# Patient Record
Sex: Male | Born: 1995 | Race: White | Hispanic: No | Marital: Single | Smoking: Former smoker
Health system: Southern US, Community
[De-identification: ages and names within clinical notes are randomized; demographics above are authoritative.]

## PROBLEM LIST (undated history)

## (undated) DIAGNOSIS — R55 Syncope and collapse: Secondary | ICD-10-CM

## (undated) DIAGNOSIS — Z9889 Other specified postprocedural states: Secondary | ICD-10-CM

## (undated) DIAGNOSIS — R569 Unspecified convulsions: Secondary | ICD-10-CM

## (undated) DIAGNOSIS — R112 Nausea with vomiting, unspecified: Secondary | ICD-10-CM

## (undated) HISTORY — PX: HAND SURGERY: SHX662

## (undated) HISTORY — DX: Unspecified convulsions: R56.9

## (undated) HISTORY — DX: Syncope and collapse: R55

---
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
## 2014-01-06 ENCOUNTER — Ambulatory Visit (INDEPENDENT_AMBULATORY_CARE_PROVIDER_SITE_OTHER): Payer: BC Managed Care – PPO | Admitting: Neurology

## 2014-01-06 ENCOUNTER — Encounter: Payer: Self-pay | Admitting: Neurology

## 2014-01-06 VITALS — BP 100/70 | HR 87 | Ht 69.0 in | Wt 152.4 lb

## 2014-01-06 DIAGNOSIS — G40A09 Absence epileptic syndrome, not intractable, without status epilepticus: Secondary | ICD-10-CM

## 2014-01-06 DIAGNOSIS — G40309 Generalized idiopathic epilepsy and epileptic syndromes, not intractable, without status epilepticus: Secondary | ICD-10-CM

## 2014-01-06 MED ORDER — LAMOTRIGINE ER 300 MG PO TB24: ORAL_TABLET | ORAL | Status: DC

## 2014-01-06 NOTE — Patient Instructions (Signed)
1. Continue Lamictal XR 300mg  daily 2. Call our office for any problems 3. Follow-up in 6 months  Seizure Precautions: 1. If medication has been prescribed for you to prevent seizures, take it exactly as directed.  Do not stop taking the medicine without talking to your doctor first, even if you have not had a seizure in a long time.   2. Avoid activities in which a seizure would cause danger to yourself or to others.  Don't operate dangerous machinery, swim alone, or climb in high or dangerous places, such as on ladders, roofs, or girders.  Do not drive unless your doctor says you may.  3. If you have any warning that you may have a seizure, lay down in a safe place where you can't hurt yourself.    4.  No driving for 6 months from last seizure, as [place] [place].   Please refer to the following link on the Epilepsy Foundation of America's website for more information: http://www.epilepsyfoundation.org/answerplace/Social/driving/drivingu.cfm   5.  Maintain good sleep hygiene.  6.  Contact your doctor if you have any problems that may be related to the medicine you are taking.  7.  Call 911 and bring the patient back to the ED if:        A.  The seizure lasts longer than 5 minutes.       B.  The patient doesn't awaken shortly after the seizure  C.  The patient has new problems such as difficulty seeing, speaking or moving  D.  The patient was injured during the seizure  E.  The patient has a temperature over 102 F (39C)  F.  The patient vomited and now is having trouble breathing

## 2014-01-06 NOTE — Progress Notes (Signed)
NEUROLOGY CONSULTATION NOTE  Jesse Henry MRN: [id number] DOB: [birth year]  Referring provider: Sharlot GowdaJohn Lalonde, MD Primary care provider: Sharlot GowdaJohn Lalonde, MD  Reason for consult:  Establish adult epilepsy care  Dear Dr Susann GivensLalonde:  Thank you for your kind referral of Jesse Henry for consultation of the above symptoms. Although his history is well known to you, please allow me to reiterate it for the purpose of our medical record. The patient was accompanied to the clinic by his father and girlfriend who also provide collateral information. Records and images were personally reviewed where available.  HISTORY OF PRESENT ILLNESS: This is a pleasant 18 year old right-handed man with a history of juvenile absence epilepsy presenting to establish adult epilepsy care. He started having staring spells at around age 89. His first generalized tonic-clonic seizure was at age 18. He denies any myoclonic jerks. His longest seizure-free interval is around 1 year. He had a recent generalized tonic-clonic seizure last 12/27/13 provoked by sleep deprivation with his newborn child. Prior to this, his last seizure was in November 2014. He usually has a warning prior to the seizure, he would feel groggy, like his head is floating on a cloud, for several hours before the GTC. His father notes that his whole face sinks in, like has not slept in a while, he would be confused, asking the same question repeatedly, then would stare before going into a convulsion. If he is on the phone or Xbox, symptoms would get worse. He has been taking Lamotrigine 300mg /day with no side effects. He had been switched from immediate release to extended-release, with improvement in insomnia.   He denies any headaches, dizziness, diplopia, dysarthria, dysphagia, neck/back pain, focal numbness/tingling/weakness, bowel/bladder dysfunction. He denies any olfactory/gustatory hallucinations, deja vu, rising epigastric sensation, focal  numbness/tingling/weakness, myoclonic jerks.  Prior AEDs: ethosuximide; dilantin; keppra  Epilepsy Risk Factors: His maternal aunt's daughter has seizures. Otherwise he had a normal birth and early development. No history of febrile convulsions, CNS infections, significant traumatic brain injury, neurosurgical procedures. He had a concussion with brief loss of consciousness after hitting his head on a soccer post.  Diagnostic Data: [facility] showed infrequent generalized polyspike and wave discharges while awake and runs a 2 to 3 Hz discharges lasting up to 2.5 seconds. There is a solitary 6-second episode of 3.5 Hz spiking wave discharges over the left hemisphere best seen in the left temporal chain without clinical accompaniments.  During sleep his generalized spike wave activity increased in frequency whether it is less than one polyspike in wave discharge every 10 seconds to more than one per minute. He again had clusters at 2 to 3 Hz lasting 2.5 seconds and an occasional multiple single discharges within a 10-second period. This was an ambulatory EEG.  I personally reviewed head CT without contrast done 03/2011 which was normal.  Last Lamictal level 04/16/13: 7.2  PAST MEDICAL HISTORY: Past Medical History  Diagnosis Date  . Seizures   . Syncope and collapse     PAST SURGICAL HISTORY: History reviewed. No pertinent past surgical history.  MEDICATIONS: Current Outpatient Prescriptions on File Prior to Visit  Medication Sig Dispense Refill  . ibuprofen (ADVIL,MOTRIN) 200 MG tablet Take 600 mg by mouth daily as needed for pain.    Lamictal XR 300mg  daily No current facility-administered medications on file prior to visit.    ALLERGIES: Allergies  Allergen Reactions  . Dilantin [Phenytoin Sodium Extended]   . Other Swelling  Blue cheese     FAMILY HISTORY: Family History  Problem Relation Age of Onset  . Cancer Maternal Aunt   . Cancer  Maternal Grandmother   . Depression Maternal Grandmother   . Early death Maternal Grandmother   . Heart attack Maternal Grandmother     Died at the age of 19  . Hypertension Paternal Grandmother   . Early death Paternal Grandmother   . Depression Paternal Grandfather     SOCIAL HISTORY: History   Social History  . Marital Status: Single    Spouse Name: N/A    Number of Children: N/A  . Years of Education: N/A   Occupational History  . Not on file.   Social History Main Topics  . Smoking status: Current Some Day Smoker    Types: Cigars  . Smokeless tobacco: Never Used     Comment: Smokes 2 cigarettes daily.  . Alcohol Use: No  . Drug Use: Yes    Special: Marijuana     Comment: Smokes marijuana twice a week  . Sexual Activity:    Partners: Female    Pharmacist, hospitalBirth Control/ Protection: Condom     Comment: ninth grade; soccer; as of May 2012   Other Topics Concern  . Not on file   Social History Narrative   Patient lives with mom, girlfriend and baby in a 2 story home.   Not working at this time.  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  Education: high school.      REVIEW OF SYSTEMS: Constitutional: No fevers, chills, or sweats, no generalized fatigue, change in appetite Eyes: No visual changes, double vision, eye pain Ear, nose and throat: No hearing loss, ear pain, nasal congestion, sore throat Cardiovascular: No chest pain, palpitations Respiratory:  No shortness of breath at rest or with exertion, wheezes GastrointestinaI: No nausea, vomiting, diarrhea, abdominal pain, fecal incontinence Genitourinary:  No dysuria, urinary retention or frequency Musculoskeletal:  No neck pain, back pain Integumentary: No rash, pruritus, skin lesions Neurological: as above Psychiatric: No depression, insomnia, anxiety Endocrine: No palpitations, fatigue, diaphoresis, mood swings, change in appetite, change in weight, increased thirst Hematologic/Lymphatic:  No anemia, purpura,  petechiae. Allergic/Immunologic: no itchy/runny eyes, nasal congestion, recent allergic reactions, rashes  PHYSICAL EXAM: Filed Vitals:   01/06/14 0956  BP: 100/70  Pulse: 87   General: No acute distress Head:  Normocephalic/atraumatic Eyes: Fundoscopic exam shows bilateral sharp discs, no vessel changes, exudates, or hemorrhages Neck: supple, no paraspinal tenderness, full range of motion Back: No paraspinal tenderness Heart: regular rate and rhythm Lungs: Clear to auscultation bilaterally. Vascular: No carotid bruits. Skin/Extremities: No rash, no edema Neurological Exam: Mental status: alert and oriented to person, place, and time, no dysarthria or aphasia, Fund of knowledge is appropriate.  Recent and remote memory are intact.  Attention and concentration are normal.    Able to name objects and repeat phrases. Cranial nerves: CN I: not tested CN II: pupils equal, round and reactive to light, visual fields intact, fundi unremarkable. CN III, IV, VI:  full range of motion, no nystagmus, no ptosis CN V: facial sensation intact CN VII: upper and lower face symmetric CN VIII: hearing intact to finger rub CN IX, X: gag intact, uvula midline CN XI: sternocleidomastoid and trapezius muscles intact CN XII: tongue midline Bulk & Tone: normal, no fasciculations. Motor: 5/5 throughout with no pronator drift. Sensation: intact to light touch, cold, pin, vibration and joint position sense.  No extinction to double simultaneous stimulation.  Romberg test negative Deep Tendon Reflexes: +2 throughout, no ankle clonus Plantar responses: downgoing bilaterally Cerebellar: no incoordination on finger to nose, heel to shin. No dysdiadochokinesia Gait: narrow-based and steady, able to tandem walk adequately. Tremor: none  IMPRESSION: This is a 18 year old right-handed man with a history of juvenile absence epilepsy with absence seizures and generalized tonic-clonic seizures. Last GTC was  12/27/13 provoked by sleep deprivation. We discussed the option of increasing dose of Lamictal as he will likely be sleep deprived with his newborn child for the next few months. He would like to continue on current dose of Lamictal XR 300mg /day for now but knows to call our office for any problems. We discussed avoidance of seizure triggers, including missing medications, sleep deprivation, and alcohol.  [place] driving laws were discussed with the patient, and he knows to stop driving after a seizure, until 6 months seizure-free. He will follow-up in 6 months or earlier if needed.   Thank you for allowing me to participate in the care of this patient. Please do not hesitate to call for any questions or concerns.   Patrcia Dolly, M.D.  CC: Dr. Susann Givens, Elveria Rising

## 2014-01-11 ENCOUNTER — Encounter: Payer: Self-pay | Admitting: Neurology

## 2014-01-21 DIAGNOSIS — G40309 Generalized idiopathic epilepsy and epileptic syndromes, not intractable, without status epilepticus: Secondary | ICD-10-CM

## 2014-03-07 ENCOUNTER — Telehealth: Payer: Self-pay | Admitting: Family

## 2014-03-07 NOTE — Telephone Encounter (Signed)
Jesse Henry left a message on general VM saying that he wanted to talk to Dr Sharene SkeansHickling. He can be reached at [phone]. TG

## 2014-03-09 NOTE — Telephone Encounter (Signed)
Jesse Henry left a message saying that he needed a prior authorization on his prescription. Then the message abruptly cut off. I called Jesse Henry and he said that he now has Medicaid and needs a PA in order to get his medicine filled. I told Jesse Henry that the pharmacy needed to send information to us or Dr Aquino's office in order for the PA to be processed. He will contact the pharmacy. TG

## 2014-03-10 NOTE — Telephone Encounter (Signed)
Jesse Henry called back to ask about the PA. I called the pharmacy and learned that he has Medicaid. I did the PA and called the pharmacy and Jesse Henry to let them know. TG

## 2014-04-27 ENCOUNTER — Encounter: Payer: Self-pay | Admitting: Medical

## 2014-04-27 ENCOUNTER — Ambulatory Visit (INDEPENDENT_AMBULATORY_CARE_PROVIDER_SITE_OTHER): Payer: Medicaid Other | Admitting: Medical

## 2014-04-27 VITALS — BP 116/76 | HR 63 | Temp 97.8°F | Wt 143.6 lb

## 2014-04-27 DIAGNOSIS — M7521 Bicipital tendinitis, right shoulder: Secondary | ICD-10-CM

## 2014-04-27 DIAGNOSIS — M62838 Other muscle spasm: Secondary | ICD-10-CM

## 2014-04-27 DIAGNOSIS — S39012A Strain of muscle, fascia and tendon of lower back, initial encounter: Secondary | ICD-10-CM | POA: Diagnosis not present

## 2014-04-27 DIAGNOSIS — S161XXA Strain of muscle, fascia and tendon at neck level, initial encounter: Secondary | ICD-10-CM

## 2014-04-27 MED ORDER — METHOCARBAMOL 500 MG PO TABS: 500.0000 mg | ORAL_TABLET | Freq: Four times a day (QID) | ORAL | Status: DC

## 2014-04-27 NOTE — Progress Notes (Signed)
Subjective: Here for pain.   He is a new father, has an infant.  He is primarily caregiver currently as mother works full time but they live together.  Usually walks around holding and rocking baby as baby enjoys moving better than being still.   The other day while bending down to get baby out of swing, felt sudden pain in back of neck radiating down to low back on the right.  Has had pain since.   He also has pains in front of right shoulder.  He is right handed.  No other arm or leg pain, no numbness, tingling, weakness.  No swelling.  No fever, no vision changes, no hearing loss.  No other recent trauma, fall, injury or strenuous activity.  Using OTC ibuprofen.  No other aggravating or relieving factors. No other complaint.  is on seizure medication, complaint, sees DR. Aquino.   Past Medical History  Diagnosis Date  . Seizures   . Syncope and collapse    ROS as in subjective   Objective: BP 116/76 mmHg  Pulse 63  Temp(Src) 97.8 F (36.6 C) (Oral)  Wt 143 lb 9.6 oz (65.137 kg)  Gen: wd, wn, nad Neck: tender posterolateral, bilat, and midline, mild pain with ROM which is slightly reduced due to pain, no thyromegaly, no mass, no lymphadenopathy Lungs clear Back: tender right paraspinal down entire back, some lower midline back tenderness, mild pain with ROM which is full otherwise, no scoliosis Legs nontender, normal ROM Arms - right biceps tendon origin tender, and mild pain with resisted biceps flexion otherwise arm exam unremarkable extremities neurovascularly intact   Assessment: Encounter Diagnoses  Name Primary?  . Biceps tendinitis, right Yes  . Neck strain, initial encounter   . Back strain, initial encounter   . Spasm of muscle     Plan: Biceps tendinitis - advised ice to right shoulder 1-2 times daily for 20 min, Aleve OTC, relative rest Neck and back strain and spasm - advised heat such as hot shower, massage, aleve, use pillows to support arms when holding baby,  and robaxin prn,but use caution, particularly avoid using if only him along at home with baby.   F/u 2wk.

## 2014-05-12 ENCOUNTER — Emergency Department (HOSPITAL_COMMUNITY)
Admission: EM | Admit: 2014-05-12 | Discharge: 2014-05-13 | Disposition: A | Discharge status: Home / Self Care | Payer: Medicaid Other | Attending: Emergency Medicine | Admitting: Emergency Medicine

## 2014-05-12 DIAGNOSIS — Z720 Tobacco use: Secondary | ICD-10-CM | POA: Diagnosis not present

## 2014-05-12 DIAGNOSIS — R0789 Other chest pain: Secondary | ICD-10-CM | POA: Insufficient documentation

## 2014-05-12 DIAGNOSIS — Z79899 Other long term (current) drug therapy: Secondary | ICD-10-CM | POA: Insufficient documentation

## 2014-05-12 DIAGNOSIS — G40909 Epilepsy, unspecified, not intractable, without status epilepticus: Secondary | ICD-10-CM | POA: Insufficient documentation

## 2014-05-12 DIAGNOSIS — R079 Chest pain, unspecified: Secondary | ICD-10-CM | POA: Diagnosis present

## 2014-05-12 DIAGNOSIS — R6883 Chills (without fever): Secondary | ICD-10-CM | POA: Diagnosis not present

## 2014-05-12 NOTE — ED Notes (Signed)
Bed: WA02 Expected date:  Expected time:  Means of arrival:  Comments: EMS 19 yo male chills, N/V and anxiety

## 2014-05-13 ENCOUNTER — Encounter (HOSPITAL_COMMUNITY): Payer: Self-pay | Admitting: Emergency Medicine

## 2014-05-13 ENCOUNTER — Emergency Department (HOSPITAL_COMMUNITY): Payer: Medicaid Other

## 2014-05-13 LAB — BASIC METABOLIC PANEL
Anion gap: 7 (ref 5–15)
BUN: 11 mg/dL (ref 6–20)
CHLORIDE: 108 mmol/L (ref 101–111)
CO2: 27 mmol/L (ref 22–32)
Calcium: 9.4 mg/dL (ref 8.9–10.3)
Creatinine, Ser: 1.23 mg/dL (ref 0.61–1.24)
GLUCOSE: 86 mg/dL (ref 70–99)
POTASSIUM: 3.4 mmol/L — AB (ref 3.5–5.1)
SODIUM: 142 mmol/L (ref 135–145)

## 2014-05-13 LAB — CBC
HCT: 44.5 % (ref 39.0–52.0)
Hemoglobin: 15.4 g/dL (ref 13.0–17.0)
MCH: 30.9 pg (ref 26.0–34.0)
MCHC: 34.6 g/dL (ref 30.0–36.0)
MCV: 89.4 fL (ref 78.0–100.0)
Platelets: 197 10*3/uL (ref 150–400)
RBC: 4.98 MIL/uL (ref 4.22–5.81)
RDW: 12.5 % (ref 11.5–15.5)
WBC: 5.4 10*3/uL (ref 4.0–10.5)

## 2014-05-13 LAB — I-STAT TROPONIN, ED: TROPONIN I, POC: 0 ng/mL (ref 0.00–0.08)

## 2014-05-13 MED ORDER — IBUPROFEN 200 MG PO TABS: 600.0000 mg | ORAL_TABLET | Freq: Three times a day (TID) | ORAL | Status: DC | PRN

## 2014-05-13 MED ORDER — KETOROLAC TROMETHAMINE 30 MG/ML IJ SOLN
30.0000 mg | Freq: Once | INTRAMUSCULAR | Status: AC
  Administered 2014-05-13: 30 mg via INTRAVENOUS
  Filled 2014-05-13: qty 1

## 2014-05-13 NOTE — ED Notes (Signed)
Per EMS: Pt c/o chills all day today, had emesis x 2 this morning, no nausea at this time. Pt also began having L side chest pain tender to palpation that started after fire department's arrival. Pt also states he hasn't been eating much the past couple of days d/t "money issues."

## 2014-05-13 NOTE — Discharge Instructions (Signed)
Chest Wall Pain Chest wall pain is pain felt in or around the chest bones and muscles. It may take up to 6 weeks to get better. It may take longer if you are active. Chest wall pain can happen on its own. Other times, things like germs, injury, coughing, or exercise can cause the pain. HOME CARE   Avoid activities that make you tired or cause pain. Try not to use your chest, belly (abdominal), or side muscles. Do not use heavy weights.  Put ice on the sore area.  Put ice in a plastic bag.  Place a towel between your skin and the bag.  Leave the ice on for 15-20 minutes for the first 2 days.  Only take medicine as told by your doctor. GET HELP RIGHT AWAY IF:   You have more pain or are very uncomfortable.  You have a fever.  Your chest pain gets worse.  You have new problems.  You feel sick to your stomach (nauseous) or throw up (vomit).  You start to sweat or feel lightheaded.  You have a cough with mucus (phlegm).  You cough up blood. MAKE SURE YOU:   Understand these instructions.  Will watch your condition.  Will get help right away if you are not doing well or get worse. Document Released: 06/12/2007 Document Revised: 03/18/2011 Document Reviewed: 08/20/2010 [facility] Patient Information 2015 [employer] [place]. This information is not intended to replace advice given to you by your health care provider. Make sure you discuss any questions you have with your health care provider. Your EKG chest xray and lab values are all normal

## 2014-05-13 NOTE — ED Provider Notes (Signed)
CSN: [id number]     Arrival date & time 05/12/14  2358 History   First MD Initiated Contact with Patient 05/13/14 0001     Chief Complaint  Patient presents with  . Chills  . Chest Pain     (Consider location/radiation/quality/duration/timing/severity/associated sxs/prior Treatment) HPI Comments: Patient states when was getting ready for bed he suddenly developed chills and stabbing chest pain that comes and goes lasting 15 seconds.  He reports that he had several episodes of vomiting throughout the days but was able to eat dinner about 4 PM and have a snack prior to bed. He states that he has been recently treated for muscle spasm in his neck and back. Denies CAD risk factors, recent travel.  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 male presenting with chest pain. The history is provided by the patient.  Chest Pain Pain location:  L chest Pain quality: aching   Pain radiates to:  Does not radiate Pain radiates to the back: no   Pain severity:  Mild Onset quality:  Sudden Duration:  1 hour Timing:  Constant Progression:  Unchanged Chronicity:  New Context: at rest   Context: not breathing, not lifting and no movement   Relieved by:  None tried Ineffective treatments:  None tried Associated symptoms: no abdominal pain, no AICD problem, no altered mental status, no anorexia, no anxiety, no back pain, no claudication, no cough, no diaphoresis, no dizziness, no dysphagia, no fatigue, no fever, no headache, no heartburn, no lower extremity edema, no nausea, no near-syncope, no numbness, no orthopnea, no palpitations, no PND, no shortness of breath, no syncope, not vomiting and no weakness   Risk factors: no aortic disease, no coronary artery disease, no diabetes mellitus, no Ehlers-Danlos syndrome, no high cholesterol, no hypertension, no immobilization, no prior DVT/PE and  no smoking     Past Medical History  Diagnosis Date  . Seizures   . Syncope and collapse    History reviewed. No pertinent past surgical history. Family History  Problem Relation Age of Onset  . Cancer Maternal Aunt   . Cancer Maternal Grandmother   . Depression Maternal Grandmother   . Early death Maternal Grandmother   . Heart attack Maternal Grandmother     Died at the age of 20  . Hypertension Paternal Grandmother   . Early death Paternal Grandmother   . Depression Paternal Grandfather    History  Substance Use Topics  . Smoking status: Current Some Day Smoker    Types: Cigars  . Smokeless tobacco: Never Used     Comment: Smokes 2 cigarettes daily.  . Alcohol Use: No    Review of Systems  Constitutional: Negative for fever, diaphoresis and fatigue.  HENT: Negative for trouble swallowing.   Respiratory: Negative for cough and shortness of breath.   Cardiovascular: Positive for chest pain. Negative for palpitations, orthopnea, claudication, syncope, PND and near-syncope.  Gastrointestinal: Negative for heartburn, nausea, vomiting, abdominal pain and anorexia.  Musculoskeletal: Negative for back pain.  Neurological: Negative for dizziness, weakness, numbness and headaches.      Allergies  Dilantin and Other  Home Medications   Prior to Admission medications   Medication Sig Start Date End Date Taking? Authorizing Provider  LamoTRIgine 300 MG TB24 Take 1 tablet at same time each day. Patient taking differently: Take 1  tablet by mouth daily.  01/06/14  Yes Van ClinesKaren M Aquino, MD  ibuprofen (ADVIL,MOTRIN) 200 MG tablet Take 3 tablets (600 mg total) by mouth every 8 (eight) hours as needed. 05/13/14   Earley FavorGail Ashritha Desrosiers, NP  methocarbamol (ROBAXIN) 500 MG tablet Take 1 tablet (500 mg total) by mouth 4 (four) times daily. Patient not taking: Reported on 05/13/2014 04/27/14   Kermit Baloavid S Tysinger, PA-C   BP 121/66 mmHg  Pulse 75  Temp(Src) 98.2 F (36.8 C) (Oral)  Resp 18  SpO2  100% Physical Exam  Constitutional: He is oriented to person, place, and time. He appears well-developed and well-nourished.  HENT:  Head: Normocephalic.  Mouth/Throat: Oropharynx is clear and moist.  Eyes: Pupils are equal, round, and reactive to light.  Neck: Normal range of motion.  Cardiovascular: Normal rate and regular rhythm.   No murmur heard. Pulmonary/Chest: Effort normal and breath sounds normal. He exhibits tenderness.  Abdominal: Soft. He exhibits no distension. There is no tenderness.  Musculoskeletal: Normal range of motion.  Lymphadenopathy:    He has no cervical adenopathy.  Neurological: He is alert and oriented to person, place, and time.  Skin: Skin is warm and dry.  Nursing note and vitals reviewed.   ED Course  Procedures (including critical care time) Labs Review Labs Reviewed  BASIC METABOLIC PANEL - Abnormal; Notable for the following:    Potassium 3.4 (*)    All other components within normal limits  CBC  I-STAT TROPOININ, ED    Imaging Review Dg Chest Port 1 View  05/13/2014   CLINICAL DATA:  Nausea and vomiting for 24 hours. Chest pain and chills, onset a few hours ago.  EXAM: PORTABLE CHEST - 1 VIEW  COMPARISON:  03/08/2010  FINDINGS: A single AP portable view of the chest demonstrates no focal airspace consolidation or alveolar edema. The lungs are grossly clear. There is no large effusion or pneumothorax. Cardiac and mediastinal contours appear unremarkable.  IMPRESSION: No active disease.   Electronically Signed   By: Ellery Plunkaniel R Mitchell M.D.   On: 05/13/2014 01:07     EKG Interpretation   Date/Time:  Friday May 13 2014 00:07:58 EDT Ventricular Rate:  91 PR Interval:  156 QRS Duration: 99 QT Interval:  352 QTC Calculation: 433 R Axis:   104 Text Interpretation:  Sinus rhythm S1,S2,S3 pattern RSR' in V1 or V2,  probably normal variant Confirmed by OTTER  MD, OLGA ([id number]) on 05/13/2014  12:20:47 AM      MDM   Final diagnoses:  Chest wall  pain         Earley FavorGail Garion Wempe, NP 05/13/14 [id number]  Marisa Severinlga Otter, MD 05/13/14 [phone]

## 2014-05-24 ENCOUNTER — Encounter: Payer: Self-pay | Admitting: Family Medicine

## 2014-05-24 ENCOUNTER — Ambulatory Visit (INDEPENDENT_AMBULATORY_CARE_PROVIDER_SITE_OTHER): Payer: Medicaid Other | Admitting: Family Medicine

## 2014-05-24 VITALS — BP 116/70 | HR 70 | Wt 144.4 lb

## 2014-05-24 DIAGNOSIS — R002 Palpitations: Secondary | ICD-10-CM

## 2014-05-24 NOTE — Progress Notes (Signed)
   Subjective:    Patient ID: Jesse Henry, male    DOB: 01/28/1995, 19 y.o.   MRN: [id number]  HPI He is here for evaluation of a several year history of intermittent left-sided chest pain. He describes the pain as throbbing and lasting for several seconds and then reoccurring several times per day. He states that then this can go away for a long period of time and then reoccur. When he has the pain he has not checked his pulse. He has not had any shortness of breath or diaphoresis.He was seen several years ago for this in the emergency room. They made a comment about heart size irregularity. He was seen recently for a different type of chest pain.   Review of Systems     Objective:   Physical Exam Alert and in no distress. Cardiac exam shows regular rhythm without murmurs or gallops. Lungs are clear to auscultation. No chest wall tenderness is noted. Review of his EKG and chest x-ray shows no major changes.       Assessment & Plan:  Palpitations - Plan: Cardiac event monitor Think the best way to help resolve this is to see if we can catch this on an event monitor. I explained that I doubt that this is really heart related to think we need to pursue this further. Hopefully the event monitor will help us determine it.

## 2014-05-24 NOTE — Progress Notes (Signed)
[facility]aw[facility] Scale[facility] office on Thursday May 19th @ 8am for event monitor. Pt is aware but may have to change that appt to a different day.

## 2014-06-17 ENCOUNTER — Ambulatory Visit (INDEPENDENT_AMBULATORY_CARE_PROVIDER_SITE_OTHER): Payer: Medicaid Other | Admitting: Family Medicine

## 2014-06-17 DIAGNOSIS — S61219A Laceration without foreign body of unspecified finger without damage to nail, initial encounter: Secondary | ICD-10-CM | POA: Diagnosis not present

## 2014-06-17 NOTE — Progress Notes (Signed)
   Subjective:    Patient ID: Jesse Henry, male    DOB: Aug 07, 1995, 19 y.o.   MRN: [id number]  HPI He sustained a 1/2 cm superficial horizontal laceration to the left index finger laterally at the DIP joint. This occurred 2 days ago.   Review of Systems     Objective:   Physical Exam 1/2 cm superficial laceration with good approximation of the edges is noted.       Assessment & Plan:  Finger laceration, initial encounter Wound was cleaned and Duo derm was applied. He is up-to-date on his shots.

## 2014-07-07 ENCOUNTER — Ambulatory Visit (INDEPENDENT_AMBULATORY_CARE_PROVIDER_SITE_OTHER): Payer: Medicaid Other | Admitting: Neurology

## 2014-07-07 ENCOUNTER — Encounter: Payer: Self-pay | Admitting: Neurology

## 2014-07-07 VITALS — BP 98/70 | HR 77 | Resp 16 | Ht 69.0 in | Wt 141.0 lb

## 2014-07-07 DIAGNOSIS — G40309 Generalized idiopathic epilepsy and epileptic syndromes, not intractable, without status epilepticus: Secondary | ICD-10-CM | POA: Diagnosis not present

## 2014-07-07 DIAGNOSIS — G40A09 Absence epileptic syndrome, not intractable, without status epilepticus: Secondary | ICD-10-CM | POA: Diagnosis not present

## 2014-07-07 MED ORDER — LAMOTRIGINE ER 300 MG PO TB24: ORAL_TABLET | ORAL | Status: DC
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
## 2014-07-07 NOTE — Progress Notes (Signed)
NEUROLOGY FOLLOW UP OFFICE NOTE  Jesse Henry [id number]  HISTORY OF PRESENT ILLNESS: I had the pleasure of seeing Jesse Henry in follow-up in the neurology clinic on 07/07/2014.  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 He is again accompanied by his father who helps supplement the history today. Since his last visit, he has been doing well with no further seizures since 12/27/13. He had some sleep deprivation with his newborn a few months ago, but once he got used to his new role as a father, he has been getting 7 hours of sleep. He denies any headaches, dizziness, diplopia, dysarthria, dysphagia, neck/back pain, focal numbness/tingling/weakness, bowel/bladder dysfunction. He denies any olfactory/gustatory hallucinations, deja vu, rising epigastric sensation, focal numbness/tingling/weakness, myoclonic jerks.   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 He started having staring spells at around age 89. His first generalized tonic-clonic seizure was at age 89. He denies any myoclonic jerks. His longest seizure-free interval is around 1 year. He had a recent generalized tonic-clonic seizure last 12/27/13 provoked by sleep deprivation with his newborn child. Prior to this, his last seizure was in November 2014. He usually has a warning prior to the seizure, he would feel groggy, like his head is floating on a cloud, for several hours before the GTC. His father notes that his whole face sinks in, like has not slept in a while, he would be confused, asking the same question repeatedly, then would stare before going into a convulsion. If he is on the phone or Xbox, symptoms would get worse. He has been taking Lamotrigine 300mg /day with no side effects. He had been switched from immediate release to extended-release, with improvement in insomnia.   Prior AEDs: ethosuximide; dilantin;  keppra  Epilepsy Risk Factors: His maternal aunt's daughter has seizures. Otherwise he had a normal birth and early development. No history of febrile convulsions, CNS infections, significant traumatic brain injury, neurosurgical procedures. He had a concussion with brief loss of consciousness after hitting his head on a soccer post.  Diagnostic Data: [facility] showed infrequent generalized polyspike and wave discharges while awake and runs a 2 to 3 Hz discharges lasting up to 2.5 seconds. There is a solitary 6-second episode of 3.5 Hz spiking wave discharges over the left hemisphere best seen in the left temporal chain without clinical accompaniments.  During sleep his generalized spike wave activity increased in frequency whether it is less than one polyspike in wave discharge every 10 seconds to more than one per minute. He again had clusters at 2 to 3 Hz lasting 2.5 seconds and an occasional multiple single discharges within a 10-second period. This was an ambulatory EEG.  I personally reviewed head CT without contrast done 03/2011 which was normal.  Last Lamictal level 04/16/13: 7.2  PAST MEDICAL HISTORY: Past Medical History  Diagnosis Date  . Seizures   . Syncope and collapse     MEDICATIONS: Current Outpatient Prescriptions on File Prior to Visit  Medication Sig Dispense Refill  . LamoTRIgine 300 MG TB24 Take 1 tablet at same time each day. (Patient taking differently: Take 1 tablet by mouth daily. ) 30 tablet 11  . ibuprofen (ADVIL,MOTRIN) 200 MG tablet Take 3 tablets (600 mg total) by mouth every 8 (eight) hours as needed. (Patient not taking: Reported on 07/07/2014) 30 tablet 0   No current facility-administered medications  on file prior to visit.    ALLERGIES: Allergies  Allergen Reactions  . Dilantin [Phenytoin Sodium Extended] Other (See Comments)    Childhood allergy  . Other Swelling    Blue cheese     FAMILY HISTORY: Family  History  Problem Relation Age of Onset  . Cancer Maternal Aunt   . Cancer Maternal Grandmother   . Depression Maternal Grandmother   . Early death Maternal Grandmother   . Heart attack Maternal Grandmother     Died at the age of 66  . Hypertension Paternal Grandmother   . Early death Paternal Grandmother   . Depression Paternal Grandfather     SOCIAL HISTORY: History   Social History  . Marital Status: Single    Spouse Name: N/A  . Number of Children: N/A  . Years of Education: N/A   Occupational History  . Not on file.   Social History Main Topics  . Smoking status: Former Smoker    Types: Cigars  . Smokeless tobacco: Never Used     Comment: Smokes 2 cigarettes daily.  . Alcohol Use: No  . Drug Use: Yes    Special: Marijuana     Comment: Smokes marijuana twice a week  . Sexual Activity:    Partners: Female    Pharmacist, hospital Protection: Condom     Comment: ninth grade; soccer; as of May 2012   Other Topics Concern  . Not on file   Social History Narrative   Patient lives with mom, girlfriend and baby in a 2 story home.   Not working at this time.  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  Education: high school.      REVIEW OF SYSTEMS: Constitutional: No fevers, chills, or sweats, no generalized fatigue, change in appetite Eyes: No visual changes, double vision, eye pain Ear, nose and throat: No hearing loss, ear pain, nasal congestion, sore throat Cardiovascular: No chest pain, palpitations Respiratory:  No shortness of breath at rest or with exertion, wheezes GastrointestinaI: No nausea, vomiting, diarrhea, abdominal pain, fecal incontinence Genitourinary:  No dysuria, urinary retention or frequency Musculoskeletal:  No neck pain, back pain Integumentary: No rash, pruritus, skin lesions Neurological: as above Psychiatric: No depression, insomnia, anxiety Endocrine: No palpitations, fatigue, diaphoresis, mood swings, change in appetite, change in weight,  increased thirst Hematologic/Lymphatic:  No anemia, purpura, petechiae. Allergic/Immunologic: no itchy/runny eyes, nasal congestion, recent allergic reactions, rashes  PHYSICAL EXAM: Filed Vitals:   07/07/14 0949  BP: 98/70  Pulse: 77  Resp: 16   General: No acute distress Head:  Normocephalic/atraumatic Neck: supple, no paraspinal tenderness, full range of motion Heart:  Regular rate and rhythm Lungs:  Clear to auscultation bilaterally Back: No paraspinal tenderness Skin/Extremities: No rash, no edema Neurological Exam: alert and oriented to person, place, and time. No aphasia or dysarthria. Fund of knowledge is appropriate.  Recent and remote memory are intact. 3/3 delayed recall.  Attention and concentration are normal.    Able to name objects and repeat phrases. Cranial nerves: Pupils equal, round, reactive to light.  Fundoscopic exam unremarkable, no papilledema. Extraocular movements intact with no nystagmus. Visual fields full. Facial sensation intact. No facial asymmetry. Tongue, uvula, palate midline.  Motor: Bulk and tone normal, muscle strength 5/5 throughout with no pronator drift.  Sensation to light touch intact.  No extinction to double simultaneous stimulation.  Deep tendon reflexes 2+ throughout, toes downgoing.  Finger to nose testing intact.  Gait narrow-based and steady, able to tandem walk  adequately.  Romberg negative.  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 Last GTC was 12/27/13 provoked by sleep deprivation. He has been seizure-free since then on Lamictal XR 300mg  daily, no side effects. Baseline Lamictal level will be done. He would like to return to driving and will be sending [place] the [facility] forms to fill out. We again discussed avoidance of seizure triggers, including missing medications, sleep deprivation, and alcohol. He is aware of [place] driving laws to stop driving after  a seizure, until 6 months seizure-free. He will follow-up in 6 months or earlier if needed.   Thank you for allowing me to participate in his care.  Please do not hesitate to call for any questions or concerns.  The duration of this appointment visit was 15 minutes of face-to-face time with the patient.  Greater than 50% of this time was spent in counseling, explanation of diagnosis, planning of further management, and coordination of care.   Patrcia Dolly, M.D.   CC: Dr. Susann Givens

## 2014-07-19 LAB — LAMOTRIGINE LEVEL: Lamotrigine Lvl: 3.6 ug/mL — ABNORMAL LOW (ref 4.0–18.0)

## 2014-07-27 ENCOUNTER — Encounter: Payer: Self-pay | Admitting: Medical

## 2014-07-27 ENCOUNTER — Ambulatory Visit
Admission: RE | Admit: 2014-07-27 | Discharge: 2014-07-27 | Disposition: A | Discharge status: Home / Self Care | Payer: Medicaid Other | Source: Ambulatory Visit | Attending: Medical | Admitting: Medical

## 2014-07-27 ENCOUNTER — Ambulatory Visit (INDEPENDENT_AMBULATORY_CARE_PROVIDER_SITE_OTHER): Payer: Medicaid Other | Admitting: Medical

## 2014-07-27 VITALS — BP 98/70 | HR 84 | Temp 98.4°F | Resp 15 | Wt 142.0 lb

## 2014-07-27 DIAGNOSIS — S6991XA Unspecified injury of right wrist, hand and finger(s), initial encounter: Secondary | ICD-10-CM

## 2014-07-27 DIAGNOSIS — M79641 Pain in right hand: Secondary | ICD-10-CM | POA: Diagnosis not present

## 2014-07-27 MED ORDER — HYDROCODONE-ACETAMINOPHEN 5-325 MG PO TABS: 1.0000 | ORAL_TABLET | Freq: Four times a day (QID) | ORAL | Status: DC | PRN

## 2014-07-27 MED ORDER — IBUPROFEN 600 MG PO TABS: 600.0000 mg | ORAL_TABLET | Freq: Three times a day (TID) | ORAL | Status: DC | PRN

## 2014-07-27 NOTE — Patient Instructions (Signed)
Encounter Diagnosis  Name Primary?  . Hand injury, right, initial encounter Yes    Recommendations:  Go for xray  Ice 10-20 minutes several times a day if possible  Use a resting hand splint that isolated the knuckles so you are in rested flexion of the fingers  Use Ibuprofen  2-3 times daily the next few days  Use Hydrocodone if needed for worse pain  If no fracture ,this may take 1- 2 weeks to feel back to normal
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
## 2014-08-16 ENCOUNTER — Telehealth: Payer: Self-pay | Admitting: Family Medicine

## 2014-08-16 NOTE — Telephone Encounter (Signed)
Patient was notified of result. He states he is taking his medication as prescribed.

## 2014-08-16 NOTE — Telephone Encounter (Signed)
-----   Message from Van Clines, MD sent at 08/16/2014  3:20 PM EDT ----- Pls let him know the Lamictal level was lower than previous, as long as he is taking his medications regularly, no need to make any medication changes. Thanks

## 2014-09-29 ENCOUNTER — Encounter: Payer: Self-pay | Admitting: Family Medicine

## 2014-10-19 ENCOUNTER — Ambulatory Visit: Payer: Medicaid Other | Admitting: Medical

## 2014-10-24 ENCOUNTER — Encounter: Payer: Self-pay | Admitting: Medical

## 2014-10-24 ENCOUNTER — Ambulatory Visit (INDEPENDENT_AMBULATORY_CARE_PROVIDER_SITE_OTHER): Payer: Medicaid Other | Admitting: Medical

## 2014-10-24 VITALS — BP 116/64 | HR 68 | Temp 97.9°F | Wt 136.0 lb

## 2014-10-24 DIAGNOSIS — M545 Low back pain, unspecified: Secondary | ICD-10-CM

## 2014-10-24 NOTE — Progress Notes (Signed)
   Subjective: Here for c/o low back pain.   Been having back pain for months, approximately 10 months, about the same time his baby was born.    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   The pain is mostly low back bilat.   His girlfriend says he has a bad curvature of the spine   He reports pain daily, all day.   No pain radiating into the legs. No numbness, tingling, or weakness in the legs.   No prior eval with xray or no prior back specialist.  Using occasional ibuprofen for pain. No recent injury, trauma, fall, no joint swelling, no other orthopedic concern at this time.  No other aggravating or relieving factors. No other complaint.  Past Medical History  Diagnosis Date  . Seizures (HCC)   . Syncope and collapse    ROS as in subjective   Objective: BP 116/64 mmHg  Pulse 68  Temp(Src) 97.9 F (36.6 C)  Wt 136 lb (61.689 kg)  Gen: wd, wn, nad Skin: unremarkable Back: mild tenderness lumbar paraspinal region vs SI joint bilat, he has very good flexibility of the back, normal ROM, no pain with ROM, very slight rightward curvature of lower thoracic and lumbar region Legs non tender, normal ROM of hips, knees, ankles, and no deformity other than mildly flat arches Legs neurovascularly intact Ext:no edema    Assessment: Encounter Diagnosis  Name Primary?  . Bilateral low back pain without sciatica Yes    Plan: discussed concerns, minimal exam findings.  Advised daily stretching routine as demonstrated and 2-3 times weekly, doing core stretching and back stringing exercising as demonstrated.  Consider shoe inserts for low arches.  Advised daily NSAID for the next 1-3 weeks.   Can go for xray if desired given the several month hx/o pain, although exam is not all that contributory today.   F/u either pending xray or in 54mo.

## 2014-10-26 ENCOUNTER — Telehealth: Payer: Self-pay

## 2014-10-26 NOTE — Telephone Encounter (Signed)
Was he informed to go to the xray I just noticed his order today and seen he hadn't gotten the xray yet.

## 2014-10-26 NOTE — Telephone Encounter (Signed)
He may or may not get it right away, which we discussed other things to do first.  So this may just sit in the que.

## 2015-01-06 ENCOUNTER — Encounter: Payer: Self-pay | Admitting: Neurology

## 2015-01-06 ENCOUNTER — Ambulatory Visit (INDEPENDENT_AMBULATORY_CARE_PROVIDER_SITE_OTHER): Payer: Medicaid Other | Admitting: Neurology

## 2015-01-06 VITALS — BP 100/60 | HR 76 | Ht 69.0 in | Wt 130.0 lb

## 2015-01-06 DIAGNOSIS — G40A09 Absence epileptic syndrome, not intractable, without status epilepticus: Secondary | ICD-10-CM | POA: Diagnosis not present

## 2015-01-06 MED ORDER — LAMOTRIGINE ER 200 MG PO TB24: ORAL_TABLET | ORAL | Status: DC

## 2015-01-06 NOTE — Progress Notes (Signed)
NEUROLOGY FOLLOW UP OFFICE NOTE  ERIVERTO BYRNES [id number]  HISTORY OF PRESENT ILLNESS: I had the pleasure of seeing Jesse Henry in follow-up in the neurology clinic on 01/06/2015.  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 He is again accompanied by his girlfried who helps supplement the history today. Since his last visit, he denies any convulsions since 12/27/13. His Lamictal level on Lamotrigine ER /day done last 07/2014 was 3.6. Since he was not having any seizures, no medication changes were made. He reports that over the past 6 months, he would have a few days where he "just feels off," as if he would have a seizure but it would not progress. These usually occur in the setting of sleep deprivation. He reports his brain is "too slow," his girlfriend reports that he does not talk much and looks like he is trying to process what is being told to him. She notices this around once a month. The last time this occurred was 12/15/14. His girlfriend denies any staring/unresponsive episodes, he denies any headaches, dizziness, diplopia, dysarthria, dysphagia, neck/back pain, focal numbness/tingling/weakness, bowel/bladder dysfunction. He denies any olfactory/gustatory hallucinations, deja vu, rising epigastric sensation, focal numbness/tingling/weakness, myoclonic jerks. He has been having weight loss, and reports that even if he is starving, he just does not have the urge to eat. He weighed 141 lbs in June, today he weighs 130 lbs. He reports mood is okay, but he was wondering about depression at one point, and gets a little more irritable. He smokes pot frequently.   HPI: This is a pleasant 19 yo RH man with a history of juvenile absence epilepsy. He started having staring spells at around age 44. His first generalized tonic-clonic seizure was at age 17. He denies any myoclonic jerks. His longest seizure-free interval is around 1 year. He had a generalized tonic-clonic  seizure last 12/27/13 provoked by sleep deprivation with his newborn child. Prior to this, his last seizure was in November 2014. He usually has a warning prior to the seizure, he would feel groggy, like his head is floating on a cloud, for several hours before the GTC. His father notes that his whole face sinks in, like has not slept in a while, he would be confused, asking the same question repeatedly, then would stare before going into a convulsion. If he is on the phone or Xbox, symptoms would get worse. He had been taking Lamotrigine /day with no side effects. He had been switched from immediate release to extended-release, with improvement in insomnia.   Prior AEDs: ethosuximide; dilantin; keppra  Epilepsy Risk Factors: His maternal aunt's daughter has seizures. Otherwise he had a normal birth and early development. No history of febrile convulsions, CNS infections, significant traumatic brain injury, neurosurgical procedures. He had a concussion with brief loss of consciousness after hitting his head on a soccer post.  Diagnostic Data: Prolonged nocturnal EEG at [facility] showed infrequent generalized polyspike and wave discharges while awake and runs a 2 to 3 Hz discharges lasting up to 2.5 seconds. There is a solitary 6-second episode of 3.5 Hz spiking wave discharges over the left hemisphere best seen in the left temporal chain without clinical accompaniments.  During sleep his generalized spike wave activity increased in frequency whether it is less than one polyspike in wave discharge every 10 seconds to more than one per minute. He again had clusters at 2 to 3 Hz lasting 2.5 seconds and an occasional multiple single discharges  within a 10-second period. This was an ambulatory EEG.  I personally reviewed head CT without contrast done 03/2011 which was normal.  Last Lamictal level 04/16/13: 7.2, 07/18/14 3.6.  PAST MEDICAL HISTORY: Past Medical History  Diagnosis Date  . Seizures  (HCC)   . Syncope and collapse     MEDICATIONS: Current Outpatient Prescriptions on File Prior to Visit  Medication Sig Dispense Refill  . ibuprofen (ADVIL,MOTRIN) 600 MG tablet Take 1 tablet (600 mg total) by mouth every 8 (eight) hours as needed. 30 tablet 0  . LamoTRIgine 300 MG TB24 Take 1 tablet at same time each day. 30 tablet 11   No current facility-administered medications on file prior to visit.    ALLERGIES: Allergies  Allergen Reactions  . Dilantin [Phenytoin Sodium Extended] Other (See Comments)    Childhood allergy  . Other Swelling    Blue cheese     FAMILY HISTORY: Family History  Problem Relation Age of Onset  . Cancer Maternal Aunt   . Cancer Maternal Grandmother   . Depression Maternal Grandmother   . Early death Maternal Grandmother   . Heart attack Maternal Grandmother     Died at the age of 4  . Hypertension Paternal Grandmother   . Early death Paternal Grandmother   . Depression Paternal Grandfather     SOCIAL HISTORY: Social History   Social History  . Marital Status: Single    Spouse Name: N/A  . Number of Children: N/A  . Years of Education: N/A   Occupational History  . Not on file.   Social History Main Topics  . Smoking status: Current Every Day Smoker    Types: Cigarettes  . Smokeless tobacco: Never Used     Comment: Smokes 2 cigarettes daily.  . Alcohol Use: No  . Drug Use: Yes    Special: Marijuana     Comment: Smokes marijuana twice a week  . Sexual Activity:    Partners: Female    Pharmacist, hospital Protection: Condom     Comment: ninth grade; soccer; as of May 2012   Other Topics Concern  . Not on file   Social History Narrative   Patient lives with mom, girlfriend and baby in a 2 story home.   Not working at this time.  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  Education: high school.      REVIEW OF SYSTEMS: Constitutional: No fevers, chills, or sweats, no generalized fatigue, change in appetite Eyes: No  visual changes, double vision, eye pain Ear, nose and throat: No hearing loss, ear pain, nasal congestion, sore throat Cardiovascular: No chest pain, palpitations Respiratory:  No shortness of breath at rest or with exertion, wheezes GastrointestinaI: No nausea, vomiting, diarrhea, abdominal pain, fecal incontinence Genitourinary:  No dysuria, urinary retention or frequency Musculoskeletal:  No neck pain, +back pain Integumentary: No rash, pruritus, skin lesions Neurological: as above Psychiatric: No depression, insomnia, anxiety Endocrine: No palpitations, fatigue, diaphoresis, mood swings, change in appetite,+ change in weight, no increased thirst Hematologic/Lymphatic:  No anemia, purpura, petechiae. Allergic/Immunologic: no itchy/runny eyes, nasal congestion, recent allergic reactions, rashes  PHYSICAL EXAM: Filed Vitals:   01/06/15 1008  BP: 100/60  Pulse: 76   General: No acute distress Head:  Normocephalic/atraumatic Neck: supple, no paraspinal tenderness, full range of motion Heart:  Regular rate and rhythm Lungs:  Clear to auscultation bilaterally Back: No paraspinal tenderness Skin/Extremities: No rash, no edema Neurological Exam: alert and oriented to person, place, and time. No aphasia  or dysarthria. Fund of knowledge is appropriate.  Recent and remote memory are intact. Attention and concentration are normal.    Able to name objects and repeat phrases. Cranial nerves: Pupils equal, round, reactive to light.  Fundoscopic exam unremarkable, no papilledema. Extraocular movements intact with no nystagmus. Visual fields full. Facial sensation intact. No facial asymmetry. Tongue, uvula, palate midline.  Motor: Bulk and tone normal, muscle strength 5/5 throughout with no pronator drift.  Sensation to light touch intact.  No extinction to double simultaneous stimulation.  Deep tendon reflexes 2+ throughout, toes downgoing.  Finger to nose testing intact.  Gait narrow-based and steady,  able to tandem walk adequately.  Romberg negative.  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 Last GTC was 12/27/13 provoked by sleep deprivation. No convulsions on Lamictal XR 300mg  daily, last Lamictal level was 3.6 (ref 4-12). We discussed that with the symptoms where he feels he is about to have a seizure but does not progress and low Lamictal level, would increase dose to 400mg /day. We again discussed avoidance of seizure triggers, including missing medications, sleep deprivation, and alcohol. We also discussed weight loss, this may relate to marijuana use, he was advised to stop, versus possible depression. He will discuss weight loss with his PCP. He is aware of [place] driving laws to stop driving after a seizure, until 6 months seizure-free. He will follow-up in 6 months or earlier if needed.   Thank you for allowing me to participate in his care.  Please do not hesitate to call for any questions or concerns.  The duration of this appointment visit was 25 minutes of face-to-face time with the patient.  Greater than 50% of this time was spent in counseling, explanation of diagnosis, planning of further management, and coordination of care.   Patrcia DollyKaren Aquino, M.D.   CC: Dr. Susann GivensLalonde

## 2015-01-06 NOTE — Patient Instructions (Addendum)
1. After you finish this prescription, increase Lamictal XR 400mg  daily (take 200mg  2 tablets daily) 2. Discuss weight loss with Dr. Susann GivensLalonde (including possible depression) 3. Follow-up in 6 months, call for any changes  Seizure Precautions: 1. If medication has been prescribed for you to prevent seizures, take it exactly as directed.  Do not stop taking the medicine without talking to your doctor first, even if you have not had a seizure in a long time.   2. Avoid activities in which a seizure would cause danger to yourself or to others.  Don't operate dangerous machinery, swim alone, or climb in high or dangerous places, such as on ladders, roofs, or girders.  Do not drive unless your doctor says you may.  3. If you have any warning that you may have a seizure, lay down in a safe place where you can't hurt yourself.    4.  No driving for 6 months from last seizure, [place].   Please refer to the following link on the Epilepsy Foundation of America's website for more information: http://www.epilepsyfoundation.org/answerplace/Social/driving/drivingu.cfm   5.  Maintain good sleep hygiene. Avoid alcohol.  6.  Contact your doctor if you have any problems that may be related to the medicine you are taking.  7.  Call 911 and bring the patient back to the ED if:        A.  The seizure lasts longer than 5 minutes.       B.  The patient doesn't awaken shortly after the seizure  C.  The patient has new problems such as difficulty seeing, speaking or moving  D.  The patient was injured during the seizure  E.  The patient has a temperature over 102 F (39C)  F.  The patient vomited and now is having trouble breathing

## 2015-02-01 ENCOUNTER — Telehealth: Payer: Self-pay | Admitting: Neurology

## 2015-02-01 NOTE — Telephone Encounter (Signed)
Returned call to patient. He wanted to verify that new Rx was sent to his pharmacy when he was here for the Lamictal change. He states when he went to the pharmacy they gave him his old Rx. I told him that the new Rx for Lamcital XR 200 mg to take 2 tablets a day was sent to his pharmacy on 01/06/15. I'm showing on my end that the Rx was received by the pharmacy. He is going to go back to the pharmacy to make sure they fill new Rx.

## 2015-02-01 NOTE — Telephone Encounter (Signed)
Pt called and had a question about his medication Lamotrigine dosage/Dawn CB# [phone]

## 2015-02-27 ENCOUNTER — Encounter: Payer: Self-pay | Admitting: Neurology

## 2015-03-15 ENCOUNTER — Encounter: Payer: Self-pay | Admitting: Family Medicine

## 2015-03-15 ENCOUNTER — Ambulatory Visit (INDEPENDENT_AMBULATORY_CARE_PROVIDER_SITE_OTHER): Payer: Medicaid Other | Admitting: Family Medicine

## 2015-03-15 VITALS — BP 104/70 | HR 103 | Wt 128.0 lb

## 2015-03-15 DIAGNOSIS — M79641 Pain in right hand: Secondary | ICD-10-CM

## 2015-03-15 NOTE — Progress Notes (Signed)
   Subjective:    Patient ID: Jesse Henry, male    DOB: 12/24/1995, 19 y.o.   MRN: [id number]  HPI Patient states that he had his right hand caught in a car door approximately one month ago.he has been using a splint however it continues to give him trouble especially on the distal fourth metacarpal.   Review of Systems     Objective:   Physical Exam Swelling and tenderness noted over the dorsal aspect of the hand near the fourth MCP joint. Clenched fist does show a slight step-off over the fourth MCP joint.anal palpation over that area.       Assessment & Plan:  Right hand pain - Plan: DG Hand Complete Right I suspect a fracture and possible referral to orthopedics.

## 2015-03-16 ENCOUNTER — Ambulatory Visit
Admission: RE | Admit: 2015-03-16 | Discharge: 2015-03-16 | Disposition: A | Discharge status: Home / Self Care | Payer: Medicaid Other | Source: Ambulatory Visit | Attending: Family Medicine | Admitting: Family Medicine

## 2015-03-16 DIAGNOSIS — M79641 Pain in right hand: Secondary | ICD-10-CM

## 2015-03-17 ENCOUNTER — Telehealth: Payer: Self-pay | Admitting: Family Medicine

## 2015-03-17 NOTE — Telephone Encounter (Signed)
Pt called for Xray results

## 2015-03-21 ENCOUNTER — Telehealth: Payer: Self-pay | Admitting: *Deleted

## 2015-03-21 NOTE — Telephone Encounter (Signed)
[facility] with Appointment time for Jesse Henry with Dr. Earl GalaWinegold: 03-30-15 @ 4:00. Patient has been notified

## 2015-06-08 ENCOUNTER — Encounter: Payer: Self-pay | Admitting: Family Medicine

## 2015-07-12 ENCOUNTER — Ambulatory Visit: Payer: Medicaid Other | Admitting: Neurology

## 2015-09-22 ENCOUNTER — Telehealth: Payer: Self-pay | Admitting: Neurology

## 2015-09-22 ENCOUNTER — Other Ambulatory Visit: Payer: Self-pay

## 2015-09-22 MED ORDER — LAMOTRIGINE 200 MG PO TABS: 200.0000 mg | ORAL_TABLET | Freq: Two times a day (BID) | ORAL | 0 refills | Status: DC

## 2015-09-22 NOTE — Telephone Encounter (Signed)
Advised pt that RX was sent and it should be cheaper.

## 2015-09-22 NOTE — Telephone Encounter (Signed)
Jesse Henry 07/24/1995. His # is [id number] He was calling in regarding Lamotrigine 200 mg. He is unable to get medication filled due to income and insurance. He was wanting to know if he could get some samples or enough until he is able to see about another Insurance on Monday 9/18. Thank you

## 2015-09-22 NOTE — Telephone Encounter (Signed)
Lamictal does not have any more samples because it is generic. Would do Lamotrigine immediate-release 200mg  1 tab BID until he gets insurance back. The immediate-release should be cheaper, but let us know if still cost-prohibitive. thanks

## 2015-09-22 NOTE — Telephone Encounter (Signed)
Is there another medication we can offer that we have samples of? Thank you.

## 2015-10-18 ENCOUNTER — Other Ambulatory Visit: Payer: Self-pay | Admitting: Neurology

## 2015-10-19 ENCOUNTER — Other Ambulatory Visit: Payer: Self-pay

## 2015-10-19 NOTE — Telephone Encounter (Signed)
RX sent to pharmacy  

## 2015-11-06 ENCOUNTER — Ambulatory Visit: Payer: Medicaid Other | Admitting: Neurology

## 2015-11-17 ENCOUNTER — Other Ambulatory Visit: Payer: Self-pay | Admitting: Neurology

## 2015-11-17 NOTE — Telephone Encounter (Signed)
Ok, pls send 6 refills, thanks

## 2015-11-17 NOTE — Telephone Encounter (Signed)
RX sent to pharmacy  

## 2015-11-17 NOTE — Telephone Encounter (Signed)
Contacted patient to see if he had his insurance fixed for me to send in a refill on the Lamictal XR . He states he does not and wants to know if he can have a refill on Lamictal 200mg  tablet. Is this okay?

## 2016-03-19 ENCOUNTER — Encounter: Payer: Self-pay | Admitting: Neurology

## 2016-03-19 ENCOUNTER — Other Ambulatory Visit: Payer: Self-pay

## 2016-03-19 ENCOUNTER — Ambulatory Visit (INDEPENDENT_AMBULATORY_CARE_PROVIDER_SITE_OTHER): Payer: Self-pay | Admitting: Neurology

## 2016-03-19 VITALS — BP 106/70 | HR 89 | Temp 97.9°F | Resp 16 | Ht 69.0 in | Wt 132.2 lb

## 2016-03-19 DIAGNOSIS — G40A09 Absence epileptic syndrome, not intractable, without status epilepticus: Secondary | ICD-10-CM

## 2016-03-19 MED ORDER — LAMOTRIGINE 200 MG PO TABS: 200.0000 mg | ORAL_TABLET | Freq: Two times a day (BID) | ORAL | 6 refills | Status: DC

## 2016-03-19 MED ORDER — ESCITALOPRAM OXALATE 10 MG PO TABS: 10.0000 mg | ORAL_TABLET | Freq: Every day | ORAL | 6 refills | Status: DC

## 2016-03-19 NOTE — Patient Instructions (Signed)
1. Bloodwork for Lamictal level 2. Continue Lamotrigine 200mg  twice a day 3. Start Lexapro 10mg  daily 4. Follow-up in 3 months, call for any changes  Seizure Precautions: 1. If medication has been prescribed for you to prevent seizures, take it exactly as directed.  Do not stop taking the medicine without talking to your doctor first, even if you have not had a seizure in a long time.   2. Avoid activities in which a seizure would cause danger to yourself or to others.  Don't operate dangerous machinery, swim alone, or climb in high or dangerous places, such as on ladders, roofs, or girders.  Do not drive unless your doctor says you may.  3. If you have any warning that you may have a seizure, lay down in a safe place where you can't hurt yourself.    4.  No driving for 6 months from last seizure, [place].   Please refer to the following link on the Epilepsy Foundation of America's website for more information: http://www.epilepsyfoundation.org/answerplace/Social/driving/drivingu.cfm   5.  Maintain good sleep hygiene. Avoid alcohol.  6.  Notify your neurology if you are planning pregnancy or if you become pregnant.  7.  Contact your doctor if you have any problems that may be related to the medicine you are taking.  8.  Call 911 and bring the patient back to the ED if:        A.  The seizure lasts longer than 5 minutes.       B.  The patient doesn't awaken shortly after the seizure  C.  The patient has new problems such as difficulty seeing, speaking or moving  D.  The patient was injured during the seizure  E.  The patient has a temperature over 102 F (39C)  F.  The patient vomited and now is having trouble breathing

## 2016-03-19 NOTE — Progress Notes (Signed)
NEUROLOGY FOLLOW UP OFFICE NOTE  Jesse Henry [id number]  HISTORY OF PRESENT ILLNESS: I had the pleasure of seeing Jesse Henry in follow-up in the neurology clinic on 03/19/2016.  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 He is again accompanied by his mother who helps supplement the history today. Since his last visit, he had called our office to report losing his insurance and having difficulty affording extended-release Lamotrigine. He was switched to immediate release Lamotrigine 200mg  BID, with no side effects. He denies any convulsions since 12/27/13. He however has had a lot of stress at home, he is raising his daughter without his girlfriend for the past 2 months, who is currently in rehab. He lives with his father and brother's family. He has had problems with his train of thought. He "talks like Yoda," mixing up words and getting confused. He reports depression and insomnia, with interrupted 8 hours of sleep. They deny any staring/unresponsive episodes, he denies any headaches, dizziness, diplopia, dysarthria, dysphagia, neck/back pain, focal numbness/tingling/weakness, bowel/bladder dysfunction. He denies any olfactory/gustatory hallucinations, deja vu, rising epigastric sensation, focal numbness/tingling/weakness, myoclonic jerks.   HPI: This is a pleasant 21 yo RH man with a history of juvenile absence epilepsy. He started having staring spells at around age 89. His first generalized tonic-clonic seizure was at age 21. He denies any myoclonic jerks. His longest seizure-free interval is around 1 year. He had a generalized tonic-clonic seizure last 12/27/13 provoked by sleep deprivation with his newborn child. Prior to this, his last seizure was in November 2014. He usually has a warning prior to the seizure, he would feel groggy, like his head is floating on a cloud, for several hours before the GTC. His father notes that his whole face sinks in, like has  not slept in a while, he would be confused, asking the same question repeatedly, then would stare before going into a convulsion. If he is on the phone or Xbox, symptoms would get worse. He had been taking Lamotrigine 300mg /day with no side effects. He had been switched from immediate release to extended-release, with improvement in insomnia.   Prior AEDs: ethosuximide; dilantin; keppra  Epilepsy Risk Factors: His maternal aunt's daughter has seizures. Otherwise he had a normal birth and early development. No history of febrile convulsions, CNS infections, significant traumatic brain injury, neurosurgical procedures. He had a concussion with brief loss of consciousness after hitting his head on a soccer post.  Diagnostic Data: Prolonged nocturnal EEG [facility] showed infrequent generalized polyspike and wave discharges while awake and runs a 2 to 3 Hz discharges lasting up to 2.5 seconds. There is a solitary 6-second episode of 3.5 Hz spiking wave discharges over the left hemisphere best seen in the left temporal chain without clinical accompaniments.  During sleep his generalized spike wave activity increased in frequency whether it is less than one polyspike in wave discharge every 10 seconds to more than one per minute. He again had clusters at 2 to 3 Hz lasting 2.5 seconds and an occasional multiple single discharges within a 10-second period. This was an ambulatory EEG.  I personally reviewed head CT without contrast done 03/2011 which was normal.  Last Lamictal level 04/16/13: 7.2, 07/18/14 3.6.  PAST MEDICAL HISTORY: Past Medical History:  Diagnosis Date  . Seizures (HCC)   . Syncope and collapse     MEDICATIONS: Current Outpatient Prescriptions on File Prior to Visit  Medication Sig Dispense Refill  .  ibuprofen (ADVIL,MOTRIN) 600 MG tablet Take 1 tablet (600 mg total) by mouth every 8 (eight) hours as needed. 30 tablet 0  . lamoTRIgine (LAMICTAL) 200 MG tablet TAKE 1 TABLET  (200 MG TOTAL) BY MOUTH 2 (TWO) TIMES DAILY. 60 tablet 6  .       No current facility-administered medications on file prior to visit.     ALLERGIES: Allergies  Allergen Reactions  . Dilantin [Phenytoin Sodium Extended] Other (See Comments)    Childhood allergy  . Other Swelling    Blue cheese     FAMILY HISTORY: Family History  Problem Relation Age of Onset  . Cancer Maternal Aunt   . Cancer Maternal Grandmother   . Depression Maternal Grandmother   . Early death Maternal Grandmother   . Heart attack Maternal Grandmother     Died at the age of 60  . Hypertension Paternal Grandmother   . Early death Paternal Grandmother   . Depression Paternal Grandfather     SOCIAL HISTORY: Social History   Social History  . Marital status: Single    Spouse name: N/A  . Number of children: N/A  . Years of education: N/A   Occupational History  . Not on file.   Social History Main Topics  . Smoking status: Current Every Day Smoker    Types: Cigarettes  . Smokeless tobacco: Never Used     Comment: Smokes 2 cigarettes daily.  . Alcohol use No  . Drug use: Yes    Types: Marijuana     Comment: Smokes marijuana twice a week  . Sexual activity: Yes    Partners: Female    Birth control/ protection: Condom     Comment: ninth grade; soccer; as of May 2012   Other Topics Concern  . Not on file   Social History Narrative   Patient lives with mom, girlfriend and baby in a 2 story home.   Not working at this time.  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  Education: high school.      REVIEW OF SYSTEMS: Constitutional: No fevers, chills, or sweats, no generalized fatigue, change in appetite Eyes: No visual changes, double vision, eye pain Ear, nose and throat: No hearing loss, ear pain, nasal congestion, sore throat Cardiovascular: No chest pain, palpitations Respiratory:  No shortness of breath at rest or with exertion, wheezes GastrointestinaI: No nausea, vomiting,  diarrhea, abdominal pain, fecal incontinence Genitourinary:  No dysuria, urinary retention or frequency Musculoskeletal:  No neck pain, +back pain Integumentary: No rash, pruritus, skin lesions Neurological: as above Psychiatric: + depression, insomnia, anxiety Endocrine: No palpitations, fatigue, diaphoresis, mood swings, change in appetite,+ change in weight, no increased thirst Hematologic/Lymphatic:  No anemia, purpura, petechiae. Allergic/Immunologic: no itchy/runny eyes, nasal congestion, recent allergic reactions, rashes  PHYSICAL EXAM: Vitals:   03/19/16 1606  BP: 106/70  Pulse: 89  Resp: 16  Temp: 97.9 F (36.6 C)   General: No acute distress Head:  Normocephalic/atraumatic Neck: supple, no paraspinal tenderness, full range of motion Heart:  Regular rate and rhythm Lungs:  Clear to auscultation bilaterally Back: No paraspinal tenderness Skin/Extremities: No rash, no edema Neurological Exam: alert and oriented to person, place, and time. No aphasia or dysarthria. Fund of knowledge is appropriate.  Recent and remote memory are intact. Attention and concentration are normal.    Able to name objects and repeat phrases. Cranial nerves: Pupils equal, round, reactive to light.  Fundoscopic exam unremarkable, no papilledema. Extraocular movements intact with no nystagmus.  Visual fields full. Facial sensation intact. No facial asymmetry. Tongue, uvula, palate midline.  Motor: Bulk and tone normal, muscle strength 5/5 throughout with no pronator drift.  Sensation to light touch intact.  No extinction to double simultaneous stimulation.  Deep tendon reflexes 2+ throughout, toes downgoing.  Finger to nose testing intact.  Gait narrow-based and steady, able to tandem walk adequately.  Romberg negative.  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 Last GTC was 12/27/13 provoked by sleep  deprivation. No convulsions on Lamotrigine 200mg  BID. He is having more cognitive issues, in the past these were associated with his seizures, he is undergoing a lot of stress at this time, this may relate to depression and poor sleep as well. We have agreed to start an antidepressant Lexapro 10mg  daily, side effects were discussed. Continue current dose of Lamotrigine for now. We will repeat Lamotrigine level, and if low, potentially increase dose. He is aware of [place] driving laws to stop driving after a seizure, until 6 months seizure-free. He will follow-up in 3 months or earlier if needed.   Thank you for allowing me to participate in his care.  Please do not hesitate to call for any questions or concerns.  The duration of this appointment visit was 25 minutes of face-to-face time with the patient.  Greater than 50% of this time was spent in counseling, explanation of diagnosis, planning of further management, and coordination of care.   Patrcia Dolly, M.D.   CC: Dr. Susann Givens

## 2016-03-21 ENCOUNTER — Encounter: Payer: Self-pay | Admitting: Neurology

## 2016-04-23 ENCOUNTER — Telehealth: Payer: Self-pay

## 2016-04-23 NOTE — Telephone Encounter (Signed)
Clld pt - advsd labwork has not been done - needed in order to complete DMV Form per provider. Pt stated he can go to have Lamictal level done on Thursday, 04/25/16. Advsd pt to make sure he does not take his medication that morning before having lab work done and to come to our office first in order to be checked in for the lab. Pt stated he understood and would come to our office first.

## 2016-04-23 NOTE — Telephone Encounter (Signed)
Also advsd pt DMV form can not be completed until lab results are received  - forms will not go out until Monday or Tuesday.  Pt stated he understood.

## 2016-04-25 ENCOUNTER — Other Ambulatory Visit: Payer: Self-pay

## 2016-04-25 ENCOUNTER — Other Ambulatory Visit: Payer: Self-pay | Admitting: *Deleted

## 2016-04-25 DIAGNOSIS — Z79899 Other long term (current) drug therapy: Secondary | ICD-10-CM

## 2016-04-27 LAB — LAMOTRIGINE LEVEL: LAMOTRIGINE LVL: 7.2 ug/mL (ref 4.0–18.0)

## 2016-07-12 ENCOUNTER — Ambulatory Visit: Payer: Self-pay | Admitting: Neurology

## 2016-09-18 ENCOUNTER — Other Ambulatory Visit: Payer: Self-pay | Admitting: Neurology

## 2016-09-18 DIAGNOSIS — G40A09 Absence epileptic syndrome, not intractable, without status epilepticus: Secondary | ICD-10-CM

## 2016-09-24 ENCOUNTER — Ambulatory Visit: Payer: Self-pay | Admitting: Neurology

## 2016-09-27 ENCOUNTER — Emergency Department (HOSPITAL_COMMUNITY)
Admission: EM | Admit: 2016-09-27 | Discharge: 2016-09-27 | Disposition: A | Discharge status: Home / Self Care | Payer: Self-pay | Attending: Emergency Medicine | Admitting: Emergency Medicine

## 2016-09-27 ENCOUNTER — Emergency Department (HOSPITAL_COMMUNITY): Payer: Self-pay

## 2016-09-27 ENCOUNTER — Encounter (HOSPITAL_COMMUNITY): Payer: Self-pay | Admitting: Emergency Medicine

## 2016-09-27 DIAGNOSIS — W228XXA Striking against or struck by other objects, initial encounter: Secondary | ICD-10-CM | POA: Insufficient documentation

## 2016-09-27 DIAGNOSIS — Y990 Civilian activity done for income or pay: Secondary | ICD-10-CM | POA: Insufficient documentation

## 2016-09-27 DIAGNOSIS — S80211A Abrasion, right knee, initial encounter: Secondary | ICD-10-CM | POA: Insufficient documentation

## 2016-09-27 DIAGNOSIS — Z23 Encounter for immunization: Secondary | ICD-10-CM | POA: Insufficient documentation

## 2016-09-27 DIAGNOSIS — F17210 Nicotine dependence, cigarettes, uncomplicated: Secondary | ICD-10-CM | POA: Insufficient documentation

## 2016-09-27 DIAGNOSIS — Y939 Activity, unspecified: Secondary | ICD-10-CM | POA: Insufficient documentation

## 2016-09-27 DIAGNOSIS — M25561 Pain in right knee: Secondary | ICD-10-CM

## 2016-09-27 DIAGNOSIS — Y929 Unspecified place or not applicable: Secondary | ICD-10-CM | POA: Insufficient documentation

## 2016-09-27 DIAGNOSIS — T148XXA Other injury of unspecified body region, initial encounter: Secondary | ICD-10-CM

## 2016-09-27 MED ORDER — IBUPROFEN 400 MG PO TABS
ORAL_TABLET | ORAL | Status: AC
  Filled 2016-09-27: qty 1

## 2016-09-27 MED ORDER — TETANUS-DIPHTH-ACELL PERTUSSIS 5-2.5-18.5 LF-MCG/0.5 IM SUSP
0.5000 mL | Freq: Once | INTRAMUSCULAR | Status: AC
  Administered 2016-09-27: 0.5 mL via INTRAMUSCULAR
  Filled 2016-09-27: qty 0.5

## 2016-09-27 MED ORDER — IBUPROFEN 400 MG PO TABS
400.0000 mg | ORAL_TABLET | Freq: Once | ORAL | Status: AC | PRN
  Administered 2016-09-27: 400 mg via ORAL

## 2016-09-27 MED ORDER — NAPROXEN 500 MG PO TABS: 500.0000 mg | ORAL_TABLET | Freq: Two times a day (BID) | ORAL | 0 refills | Status: DC

## 2016-09-27 MED ORDER — IBUPROFEN 400 MG PO TABS
600.0000 mg | ORAL_TABLET | Freq: Once | ORAL | Status: AC
  Administered 2016-09-27: 600 mg via ORAL
  Filled 2016-09-27: qty 1

## 2016-09-27 NOTE — Progress Notes (Signed)
Orthopedic Tech Progress Note Patient Details:  Jesse Henry 1995-06-26 [id number]  Ortho Devices Type of Ortho Device: Knee Immobilizer Ortho Device/Splint Location: Right knee\leg Ortho Device/Splint Interventions: Application, Adjustment   [name] 09/27/2016, 8:49 PM

## 2016-09-27 NOTE — ED Provider Notes (Signed)
[facility]-EMERGENCY DEPT Provider Note   CSN: [id number] Arrival date & time: 09/27/16  1551     History   Chief Complaint Chief Complaint  Patient presents with  . Leg Pain    HPI Jesse Henry is a 21 y.o. male who presents emergency department today after stone fell on right leg while at work. The patient works as in Sales promotion account executive. He was lying on his left side examing under a deck when a stone measuring partially 14 inches fell from 2 feet hitting the outside of his right knee. The patient has noted 3 cm 3 cm abrasion. Bleeding is well controlled. He is now having pain to his knee is primarily on the lateral portion over where the abrasion is located. He has been able to ambulate after the event. He denies decreased range of motion but notes that it is painful. He is taking ibuprofen for this which relieved his pain but states is now worn off. He denies any numbness, tingling or weaknessof the lower extremity. Tetanus not uptodate.   HPI  Past Medical History:  Diagnosis Date  . Seizures (HCC)   . Syncope and collapse     Patient Active Problem List   Diagnosis Date Noted  . Juvenile absence epilepsy (HCC) 07/07/2014  . Insomnia 10/26/2013  . Long-term use of high-risk medication 05/05/2012  . Transient alteration of awareness 05/05/2012  . Other convulsions 05/05/2012  . Postconcussion syndrome 05/05/2012  . Syncope and collapse 05/05/2012  . 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 H/O absence seizures 04/06/2011  . Tonic clonic seizures (HCC) 04/06/2011    History reviewed. No pertinent surgical history.     Home Medications    Prior to Admission medications   Medication Sig Start Date End Date Taking? Authorizing Provider  escitalopram (LEXAPRO) 10 MG tablet TAKE 1 TABLET (10 MG TOTAL) BY MOUTH DAILY. 09/18/16   Van Clines, MD  ibuprofen (ADVIL,MOTRIN) 600 MG tablet Take 1 tablet (600 mg total)  by mouth every 8 (eight) hours as needed. 07/27/14   Tysinger, Kermit Balo, PA-C  lamoTRIgine (LAMICTAL) 200 MG tablet TAKE 1 TABLET (200 MG TOTAL) BY MOUTH 2 (TWO) TIMES DAILY. 09/18/16   Van Clines, MD  naproxen (NAPROSYN) 500 MG tablet Take 1 tablet (500 mg total) by mouth 2 (two) times daily. 09/27/16   Charde Macfarlane, Elmer Sow, PA-C    Family History Family History  Problem Relation Age of Onset  . Cancer Maternal Grandmother   . Depression Maternal Grandmother   . Early death Maternal Grandmother   . Heart attack Maternal Grandmother        Died at the age of 80  . Hypertension Paternal Grandmother   . Early death Paternal Grandmother   . Depression Paternal Grandfather   . Cancer Maternal Aunt     Social History Social History  Substance Use Topics  . Smoking status: Current Every Day Smoker    Types: Cigarettes  . Smokeless tobacco: Current User     Comment: Smokes 2 cigarettes daily.  . Alcohol use No     Allergies   Dilantin [phenytoin sodium extended] and Other   Review of Systems Review of Systems  Musculoskeletal: Positive for arthralgias. Negative for gait problem and joint swelling.  Skin: Positive for wound.  Neurological: Negative for numbness.     Physical Exam Updated Vital Signs BP 120/67 (BP Location: Right Arm)   Pulse 79   Temp 97.7 F (  36.5 C) (Oral)   Resp 20   Ht  (1.753 m)   Wt 61.2 kg (135 lb)   SpO2 98%   BMI 19.94 kg/m   Physical Exam  Constitutional: He appears well-developed and well-nourished.  HENT:  Head: Normocephalic and atraumatic.  Right Ear: External ear normal.  Left Ear: External ear normal.  Eyes: Conjunctivae are normal. Right eye exhibits no discharge. Left eye exhibits no discharge. No scleral icterus.  Cardiovascular:  Pulses:      Dorsalis pedis pulses are 2+ on the right side, and 2+ on the left side.       Posterior tibial pulses are 2+ on the right side, and 2+ on the left side.  Pulmonary/Chest: Effort  normal. No respiratory distress.  Musculoskeletal:  Right knee: there is a 3 cm 3 cm abrasion noted to the lateral aspect of the right knee. No skin swelling, erythema, heat, fluctuance. Bleeding is well controlled. No obvious bony deformity.  TTP diffusley over the knee. Active and passive flexion and extension intact but noted to be painful. No crepitus. Negative Lachman's test. Negative anterior/poster drawer bilaterally. Negative ballottement test. No varus or valgus laxity or locking. No TTP of hips or ankles. Compartments soft. Neurovascularly intact distally to site of injury.   Neurological: He is alert. He has normal strength. No sensory deficit.  Gait able but patient notes it is painful.   Skin: No pallor.  Psychiatric: He has a normal mood and affect.  Nursing note and vitals reviewed.    ED Treatments / Results  Labs (all labs ordered are listed, but only abnormal results are displayed) Labs Reviewed - No data to display  EKG  EKG Interpretation None       Radiology Dg Knee Complete 4 Views Right  Result Date: 09/27/2016 CLINICAL DATA:  Rock fell on right leg at work.  Initial encounter. EXAM: RIGHT KNEE - COMPLETE 4+ VIEW COMPARISON:  05/14/2009 FINDINGS: No evidence of fracture, dislocation, or joint effusion. No evidence of arthropathy or other focal bone abnormality. Soft tissues are unremarkable. IMPRESSION: Negative. Electronically Signed   By: Marnee Spring M.D.   On: 09/27/2016 16:48    Procedures Procedures (including critical care time)  Medications Ordered in ED Medications  ibuprofen (ADVIL,MOTRIN) 400 MG tablet (not administered)  ibuprofen (ADVIL,MOTRIN) tablet 400 mg (400 mg Oral Given 09/27/16 1605)  Tdap (BOOSTRIX) injection 0.5 mL (0.5 mLs Intramuscular Given 09/27/16 2007)  ibuprofen (ADVIL,MOTRIN) tablet 600 mg (600 mg Oral Given 09/27/16 2007)     Initial Impression / Assessment and Plan / ED Course  I have reviewed the triage vital signs  and the nursing notes.  Pertinent labs & imaging results that were available during my care of the patient were reviewed by me and considered in my medical decision making (see chart for details).     21 y/o presenting after stone fell on right knee at work. Patient ambulatory with relief from advil. Notes pain with movement and at the site of abrasion. Tetanus updated. Exam reassuring as above. Xrays negative for fracture. Wound cleanse and dressed. Advised conservative therapy and follow up with PCP vs ortho. Patient to return for worsening symptoms or signs of skin infection. Wound care instructions given.  Patient in agreement with plan.   Final Clinical Impressions(s) / ED Diagnoses   Final diagnoses:  Acute pain of right knee  Abrasion    New Prescriptions Discharge Medication List as of 09/27/2016  8:02 PM  START taking these medications   Details  naproxen (NAPROSYN) 500 MG tablet Take 1 tablet (500 mg total) by mouth 2 (two) times daily., Starting Fri 09/27/2016, Print         Shalva Rozycki, Elmer Sow, PA-C 09/28/16 Lorine Bears    Shaune Pollack, MD 09/28/16 [id number]

## 2016-09-27 NOTE — ED Triage Notes (Signed)
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 Abrasions noted to right knee, pt able to move right leg.

## 2016-09-27 NOTE — Discharge Instructions (Signed)
Please read and follow all provided instructions.  You have been seen today for right knee pain  Tests performed today include: An x-ray of the affected area - does NOT show any broken bones or dislocations.  Vital signs. See below for your results today.  Your tetanus vaccine was updated today.   Home care instructions: -- *PRICE in the first 24-48 hours after injury: Protect (with brace, splint, sling), if given by your provider - you said you have crutches at home. Use these until he tolerates walking.  Rest Ice- Do not apply ice pack directly to your skin, place towel or similar between your skin and ice/ice pack. Apply ice for 20 min, then remove for 40 min while awake Compression- Wear brace, elastic bandage, splint as directed by your provider Elevate affected extremity above the level of your heart when not walking around for the first 24-48 hours   Use Naproxen as needed for pain  Clean your wound daily with soap and water. Apply antibotic ointment to the area. Follow dressing instructions attached.   Follow-up instructions: Please follow-up with your primary care provider if you continue to have significant pain in 1 week. In this case you may have a more severe injury that requires further care.   Return instructions:  Please return if your toes or feet are numb or tingling, appear gray or blue, or you have severe pain (also elevate the leg and loosen splint or wrap if you were given one Please return if you develop fever, chills or erythema/heat/drainage around your wound Please return to the Emergency Department if you experience worsening symptoms.  Please return if you have any other emergent concerns. Additional Information:  Your vital signs today were: BP 115/81 (BP Location: Right Arm)    Pulse 85    Temp 97.7 F (36.5 C) (Oral)    Resp 15    Ht  (1.753 m)    Wt 61.2 kg (135 lb)    SpO2 100%    BMI 19.94 kg/m  If your blood pressure (BP) was elevated above  135/85 this visit, please have this repeated by your doctor within one month. ---------------

## 2016-10-15 ENCOUNTER — Other Ambulatory Visit: Payer: Self-pay

## 2016-10-15 DIAGNOSIS — G40A09 Absence epileptic syndrome, not intractable, without status epilepticus: Secondary | ICD-10-CM

## 2016-10-15 MED ORDER — LAMOTRIGINE 200 MG PO TABS: 200.0000 mg | ORAL_TABLET | Freq: Two times a day (BID) | ORAL | 3 refills | Status: DC

## 2016-11-17 ENCOUNTER — Other Ambulatory Visit: Payer: Self-pay | Admitting: Neurology

## 2016-11-17 DIAGNOSIS — G40A09 Absence epileptic syndrome, not intractable, without status epilepticus: Secondary | ICD-10-CM

## 2018-02-27 ENCOUNTER — Other Ambulatory Visit: Payer: Self-pay | Admitting: Neurology

## 2018-02-27 DIAGNOSIS — G40A09 Absence epileptic syndrome, not intractable, without status epilepticus: Secondary | ICD-10-CM

## 2018-02-27 MED ORDER — ESCITALOPRAM OXALATE 10 MG PO TABS: 10.0000 mg | ORAL_TABLET | Freq: Every day | ORAL | 5 refills | Status: DC

## 2018-07-18 ENCOUNTER — Other Ambulatory Visit: Payer: Self-pay | Admitting: Neurology

## 2018-07-18 DIAGNOSIS — G40A09 Absence epileptic syndrome, not intractable, without status epilepticus: Secondary | ICD-10-CM

## 2018-08-17 ENCOUNTER — Other Ambulatory Visit: Payer: Self-pay | Admitting: Neurology

## 2018-08-17 DIAGNOSIS — G40A09 Absence epileptic syndrome, not intractable, without status epilepticus: Secondary | ICD-10-CM

## 2018-08-22 ENCOUNTER — Other Ambulatory Visit: Payer: Self-pay | Admitting: Neurology

## 2018-08-22 DIAGNOSIS — G40A09 Absence epileptic syndrome, not intractable, without status epilepticus: Secondary | ICD-10-CM

## 2018-08-25 ENCOUNTER — Telehealth: Payer: Self-pay | Admitting: Neurology

## 2018-08-25 DIAGNOSIS — G40A09 Absence epileptic syndrome, not intractable, without status epilepticus: Secondary | ICD-10-CM

## 2018-08-25 MED ORDER — LAMOTRIGINE 200 MG PO TABS: 200.0000 mg | ORAL_TABLET | Freq: Two times a day (BID) | ORAL | 0 refills | Status: DC

## 2018-08-25 NOTE — Telephone Encounter (Signed)
Patient has appt sch for 09-08-18 and needs a refill on the medication lamotrigine or enough to get him to the appt on 09-08-18.  [facility] uses  [facility] on [address]

## 2018-08-25 NOTE — Telephone Encounter (Signed)
Rx sent 

## 2018-09-08 ENCOUNTER — Telehealth (INDEPENDENT_AMBULATORY_CARE_PROVIDER_SITE_OTHER): Payer: Self-pay | Admitting: Neurology

## 2018-09-08 ENCOUNTER — Other Ambulatory Visit: Payer: Self-pay

## 2018-09-08 NOTE — Progress Notes (Signed)
No-show on 09/08/2018

## 2018-09-16 ENCOUNTER — Other Ambulatory Visit: Payer: Self-pay | Admitting: Neurology

## 2018-09-16 DIAGNOSIS — G40A09 Absence epileptic syndrome, not intractable, without status epilepticus: Secondary | ICD-10-CM

## 2018-09-21 ENCOUNTER — Other Ambulatory Visit: Payer: Self-pay

## 2018-09-21 ENCOUNTER — Telehealth: Payer: Self-pay | Admitting: Neurology

## 2018-09-21 DIAGNOSIS — G40A09 Absence epileptic syndrome, not intractable, without status epilepticus: Secondary | ICD-10-CM

## 2018-09-21 MED ORDER — LAMOTRIGINE 200 MG PO TABS: 200.0000 mg | ORAL_TABLET | Freq: Two times a day (BID) | ORAL | 0 refills | Status: DC

## 2018-09-21 NOTE — Telephone Encounter (Signed)
Patient is scheduled for next available appt- 10/15/18 VV and is needing a refill on the Lamotrigine medication. He is completely out. To the [facility] [address] in [place] please. Send enough to last until appt. Thanks!

## 2018-09-21 NOTE — Telephone Encounter (Signed)
One refill sent. Pt has to keep next appt. No further refills.

## 2018-09-23 ENCOUNTER — Telehealth: Payer: Self-pay | Admitting: Neurology

## 2018-09-23 NOTE — Telephone Encounter (Signed)
Patient called needing to have a refill on his Lamotrigine medication. He ran out of medication today. He sees Dr. Delice Lesch in October. Thanks

## 2018-09-23 NOTE — Telephone Encounter (Signed)
Pt informed that Lamotrigine was sent in on 09/21/18. He needs to keep appt on 10/15/18 for further refills. Pt understood.

## 2018-10-15 ENCOUNTER — Telehealth (INDEPENDENT_AMBULATORY_CARE_PROVIDER_SITE_OTHER): Payer: Self-pay | Admitting: Neurology

## 2018-10-15 ENCOUNTER — Encounter: Payer: Self-pay | Admitting: Neurology

## 2018-10-15 ENCOUNTER — Other Ambulatory Visit: Payer: Self-pay

## 2018-10-15 DIAGNOSIS — G40A09 Absence epileptic syndrome, not intractable, without status epilepticus: Secondary | ICD-10-CM

## 2018-10-15 MED ORDER — LAMOTRIGINE 200 MG PO TABS: ORAL_TABLET | ORAL | 3 refills | Status: DC

## 2018-10-15 NOTE — Progress Notes (Signed)
Virtual Visit via Video Note The purpose of this virtual visit is to provide medical care while limiting exposure to the novel coronavirus.    Consent was obtained for video visit:  Yes.   Answered questions that patient had about telehealth interaction:  Yes.   I discussed the limitations, risks, security and privacy concerns of performing an evaluation and management service by telemedicine. I also discussed with the patient that there may be a patient responsible charge related to this service. The patient expressed understanding and agreed to proceed.  Pt location: Home Physician Location: office Name of referring provider:  Denita Lung, MD I connected with Jesse Henry at patients initiation/request on 10/15/2018 at  9:30 AM EDT by video enabled telemedicine application and verified that I am speaking with the correct person using two identifiers. Pt MRN:  [id number] Pt DOB:  [birth year] Video Participants:  Jesse Henry (father)   History of Present Illness:  The patient was seen as a virtual video visit on 10/15/2018. He was last seen in the neurology clinic in March 2018. He had missed his appointment and reports that he was in jail over a year ago where he had 2 seizures after being unable to get his medications properly. He reports this was the last time he had a seizure. He denies any gaps in time, myoclonic jerks. His father has not seen any staring/unresponsive episodes, but is concerned about the future for Jesse Henry because he can't always keep his train of thought. He does this on a daily basis, at least once a day. Jesse Henry does not recognize it himself. He works at [employer] and has not noticed any issues. He has been taking the 2 tablets of Lamotrigine 200mg  every morning instead of BID because he tends to forget the evening dose. He denies any side effects, no headaches, dizziness, diplopia, gait instability. He has tripped a few times, no injuries.  The other day he noticed that his left foot kept falling asleep as he was walking to work. No neck or back pain. He lives with his father and does not drive. Mood overall okay.  History on Initial Assessment 01/06/2014: This is a pleasant 23 yo RH man with a history of juvenile absence epilepsy. He started having staring spells at around age 22. His first generalized tonic-clonic seizure was at age 42. He denies any myoclonic jerks. His longest seizure-free interval is around 1 year. He had a generalized tonic-clonic seizure last 12/27/13 provoked by sleep deprivation with his newborn child. Prior to this, his last seizure was in November 2014. He usually has a warning prior to the seizure, he would feel groggy, like his head is floating on a cloud, for several hours before the GTC. His father notes that his whole face sinks in, like has not slept in a while, he would be confused, asking the same question repeatedly, then would stare before going into a convulsion. If he is on the phone or Xbox, symptoms would get worse. He had been taking Lamotrigine 300mg /day with no side effects. He had been switched from immediate release to extended-release, with improvement in insomnia.   Prior AEDs: ethosuximide; dilantin; keppra  Epilepsy Risk Factors: His maternal aunt's daughter has seizures. Otherwise he had a normal birth and early development. No history of febrile convulsions, CNS infections, significant traumatic brain injury, neurosurgical procedures. He had a concussion with brief loss of consciousness after hitting his head on a soccer post.  Diagnostic  Data: Prolonged nocturnal EEG at [facility] showed infrequent generalized polyspike and wave discharges while awake and runs a 2 to 3 Hz discharges lasting up to 2.5 seconds. There is a solitary 6-second episode of 3.5 Hz spiking wave discharges over the left hemisphere best seen in the left temporal chain without clinical accompaniments.  During sleep  his generalized spike wave activity increased in frequency whether it is less than one polyspike in wave discharge every 10 seconds to more than one per minute. He again had clusters at 2 to 3 Hz lasting 2.5 seconds and an occasional multiple single discharges within a 10-second period. This was an ambulatory EEG.  I personally reviewed head CT without contrast done 03/2011 which was normal.  Last Lamictal level 04/16/13: 7.2, 07/18/14 3.6.    Current Outpatient Medications on File Prior to Visit  Medication Sig Dispense Refill   ibuprofen (ADVIL,MOTRIN) 600 MG tablet Take 1 tablet (600 mg total) by mouth every 8 (eight) hours as needed. 30 tablet 0   lamoTRIgine (LAMICTAL) 200 MG tablet Take 1 tablet (200 mg total) by mouth 2 (two) times daily. (Patient taking differently: Take 200 mg by mouth daily. ) 60 tablet 0   No current facility-administered medications on file prior to visit.      Observations/Objective:   Vitals:   10/15/18 0859  Weight: 125 lb (56.7 kg)  Height: 5\' 9"  (1.753 m)   GEN:  The patient appears stated age and is in NAD.  Neurological examination: Patient is awake, alert, oriented x 3. No aphasia or dysarthria. Intact fluency and comprehension. Remote and recent memory intact. Able to name and repeat. Cranial nerves: Extraocular movements intact with no nystagmus. No facial asymmetry. Motor: moves all extremities symmetrically, at least anti-gravity x 4. No incoordination on finger to nose testing. Gait: narrow-based and steady, able to tandem walk adequately. Negative Romberg test.   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 Last GTC was over a year ago due to medication issues. Longest seizure-free interval up to 3 years. He has been taking Lamotrigine 200mg  2 tabs daily due to difficulty with compliance taking BID medication, extended-release formulation  cost-prohibitive. Refills sent. His father reports daily episodes of losing train of thought, unclear if seizure-related, a 24-hour EEG will be ordered to classify events. He is not driving and is aware of [place] driving laws to stop driving after a seizure, until 6 months seizure-free. He will follow-up in 6 months or earlier if needed.    Follow Up Instructions:   -I discussed the assessment and treatment plan with the patient. The patient was provided an opportunity to ask questions and all were answered. The patient agreed with the plan and demonstrated an understanding of the instructions.   The patient was advised to call back or seek an in-person evaluation if the symptoms worsen or if the condition fails to improve as anticipated.    30, MD

## 2018-12-09 ENCOUNTER — Telehealth: Payer: Self-pay | Admitting: Neurology

## 2018-12-09 MED ORDER — ESCITALOPRAM OXALATE 10 MG PO TABS: 10.0000 mg | ORAL_TABLET | Freq: Every day | ORAL | 3 refills | Status: DC

## 2018-12-09 NOTE — Telephone Encounter (Signed)
I had prescribed him Lexapro in 2018 for depression and anxiety, if he is agreeable to restarting, pls send in Rx for lexapro 10mg  daily, thanks

## 2018-12-09 NOTE — Telephone Encounter (Signed)
Patient called in regarding him having nightmares and Anxiety Attacks more frequently. Please Call. Thank you

## 2018-12-09 NOTE — Telephone Encounter (Signed)
Patient agreeable to starting lexapro.  Rx sent to pharmacy.

## 2018-12-09 NOTE — Addendum Note (Signed)
Addended by: Amado Coe on: 12/09/2018 03:58 PM   Modules accepted: Orders

## 2018-12-09 NOTE — Telephone Encounter (Signed)
Anxiety has increased the last two weeks out of no where, its happening at work when he is alone.  He had a dream that he was choking his daughter.  He says its not bad when he is around other but is worried and would like to know what can be done.

## 2019-03-01 ENCOUNTER — Telehealth: Payer: Self-pay | Admitting: Neurology

## 2019-03-01 NOTE — Telephone Encounter (Signed)
Its a drink he is going to do at home to clean out his system for a drug test in 3 days to try and get a job

## 2019-03-01 NOTE — Telephone Encounter (Signed)
Pt called informed that he will be fine doing the detox with his seizure medication

## 2019-03-01 NOTE — Telephone Encounter (Signed)
That is fine for his seizure medication. Thanks

## 2019-03-01 NOTE — Telephone Encounter (Signed)
Left message with the after hour service on 02-26-19 @ 6:23 pm  Caller wants to know if he does a Detox for a drug test does that take his medication out of his system please call

## 2019-03-01 NOTE — Telephone Encounter (Signed)
What kind of detox does he mean? Is this a detox program or something he will be ingesting?

## 2019-05-06 ENCOUNTER — Encounter: Payer: Self-pay | Admitting: Neurology

## 2019-05-06 ENCOUNTER — Ambulatory Visit: Payer: Self-pay | Admitting: Neurology

## 2019-05-06 ENCOUNTER — Ambulatory Visit (INDEPENDENT_AMBULATORY_CARE_PROVIDER_SITE_OTHER): Payer: Self-pay | Admitting: Neurology

## 2019-05-06 ENCOUNTER — Other Ambulatory Visit: Payer: Self-pay

## 2019-05-06 VITALS — BP 137/75 | HR 89 | Ht 69.0 in | Wt 132.0 lb

## 2019-05-06 DIAGNOSIS — G40A09 Absence epileptic syndrome, not intractable, without status epilepticus: Secondary | ICD-10-CM

## 2019-05-06 DIAGNOSIS — F419 Anxiety disorder, unspecified: Secondary | ICD-10-CM

## 2019-05-06 MED ORDER — LAMOTRIGINE 200 MG PO TABS: ORAL_TABLET | ORAL | 3 refills | Status: DC

## 2019-05-06 MED ORDER — ESCITALOPRAM OXALATE 10 MG PO TABS: 10.0000 mg | ORAL_TABLET | Freq: Every day | ORAL | 3 refills | Status: DC

## 2019-05-06 NOTE — Patient Instructions (Signed)
1. Schedule 24-hour EEG  2. Start taking the Lexapro on a regular basis and see if this helps with sleep  3. Continue Lamictal 200mg : take 2 tablets every morning  4. Follow-up in 6 months, call for any changes  Seizure Precautions: 1. If medication has been prescribed for you to prevent seizures, take it exactly as directed.  Do not stop taking the medicine without talking to your doctor first, even if you have not had a seizure in a long time.   2. Avoid activities in which a seizure would cause danger to yourself or to others.  Don't operate dangerous machinery, swim alone, or climb in high or dangerous places, such as on ladders, roofs, or girders.  Do not drive unless your doctor says you may.  3. If you have any warning that you may have a seizure, lay down in a safe place where you can't hurt yourself.    4.  No driving for 6 months from last seizure, as per [facility].   Please refer to the following link on the Epilepsy [employer] of [employer]'s website for more information: http://www.epilepsyfoundation.org/answerplace/Social/driving/drivingu.cfm   5.  Maintain good sleep hygiene. Avoid alcohol.  6.  Contact your doctor if you have any problems that may be related to the medicine you are taking.  7.  Call 911 and bring the patient back to the ED if:        A.  The seizure lasts longer than 5 minutes.       B.  The patient doesn't awaken shortly after the seizure  C.  The patient has new problems such as difficulty seeing, speaking or moving  D.  The patient was injured during the seizure  E.  The patient has a temperature over 102 F (39C)  F.  The patient vomited and now is having trouble breathing

## 2019-05-06 NOTE — Progress Notes (Signed)
NEUROLOGY FOLLOW UP OFFICE NOTE  Jesse Henry [id number] 11/21/1995  HISTORY OF PRESENT ILLNESS: I had the pleasure of seeing Jesse Henry in follow-up in the neurology clinic on 05/06/2019.  The patient was last seen 6 months ago for epilepsy. He is on Lamotrigine 200mg  2 tabs daily (total 400mg  daily dose), and denies any seizures since he was in jail 1.5 years ago and unable to get his medications properly. He denies any myoclonic jerks. His father was reporting inability to keep his train of thought, we had discussed doing a 24-hour EEG, but he has not done it yet. He reports short-term memory issues, he forgets what he was going to do at work, simple things throughout the day that he was just told to do. He had called our office regarding increased anxiety last December, and was started on Lexapro 10mg  daily. He states he has not been taking it daily, anxiety is not as bad, he takes it prn and feels it helps when he takes it. He feels that having a job has also helped with anxiety. He denies any headaches, dizziness, diplopia, focal numbness/tingling/weakness, no falls. Sleep is not good, he works until midnight, then is unable to sleep until 6am. He wakes up at 1pm. He has issues falling asleep. Melatonin did not help. He also reports that rarely he feels his feet moving/vibrating from knees down to the feet, mostly when cold and when he is not doing things. These episodes last 5 seconds.   History on Initial Assessment 01/06/2014: This is a pleasant 24 yo RH man with a history of juvenile absence epilepsy. He started having staring spells at around age 38. His first generalized tonic-clonic seizure was at age 4. He denies any myoclonic jerks. His longest seizure-free interval is around 1 year. He had a generalized tonic-clonic seizure last 12/27/13 provoked by sleep deprivation with his newborn child. Prior to this, his last seizure was in November 2014. He usually has a warning prior to the  seizure, he would feel groggy, like his head is floating on a cloud, for several hours before the GTC. His father notes that his whole face sinks in, like has not slept in a while, he would be confused, asking the same question repeatedly, then would stare before going into a convulsion. If he is on the phone or Xbox, symptoms would get worse. He had been taking Lamotrigine 300mg /day with no side effects. He had been switched from immediate release to extended-release, with improvement in insomnia.   Prior AEDs: ethosuximide; dilantin; keppra  Epilepsy Risk Factors: His maternal aunt's daughter has seizures. Otherwise he had a normal birth and early development. No history of febrile convulsions, CNS infections, significant traumatic brain injury, neurosurgical procedures. He had a concussion with brief loss of consciousness after hitting his head on a soccer post.  Diagnostic Data: Prolonged nocturnal EEG at [facility] showed infrequent generalized polyspike and wave discharges while awake and runs a 2 to 3 Hz discharges lasting up to 2.5 seconds. There is a solitary 6-second episode of 3.5 Hz spiking wave discharges over the left hemisphere best seen in the left temporal chain without clinical accompaniments.  During sleep his generalized spike wave activity increased in frequency whether it is less than one polyspike in wave discharge every 10 seconds to more than one per minute. He again had clusters at 2 to 3 Hz lasting 2.5 seconds and an occasional multiple single discharges within a 10-second period. This was an ambulatory  EEG.  I personally reviewed head CT without contrast done 03/2011 which was normal.  Last Lamictal level 04/16/13: 7.2, 07/18/14 3.6.   PAST MEDICAL HISTORY: Past Medical History:  Diagnosis Date  . Seizures ([place])   . Syncope and collapse     MEDICATIONS: Current Outpatient Medications on File Prior to Visit  Medication Sig Dispense Refill  . escitalopram  (LEXAPRO) 10 MG tablet Take 1 tablet (10 mg total) by mouth daily. 90 tablet 3  . ibuprofen (ADVIL,MOTRIN) 600 MG tablet Take 1 tablet (600 mg total) by mouth every 8 (eight) hours as needed. 30 tablet 0  . lamoTRIgine (LAMICTAL) 200 MG tablet Take 2 tablets every morning 180 tablet 3   No current facility-administered medications on file prior to visit.    ALLERGIES: Allergies  Allergen Reactions  . Dilantin [Phenytoin Sodium Extended] Other (See Comments)    Childhood allergy  . Other Swelling    Blue cheese     FAMILY HISTORY: Family History  Problem Relation Age of Onset  . Cancer Maternal Grandmother   . Depression Maternal Grandmother   . Early death Maternal Grandmother   . Heart attack Maternal Grandmother        Died at the age of 107  . Hypertension Paternal Grandmother   . Early death Paternal Grandmother   . Depression Paternal Grandfather   . Cancer Maternal Aunt     SOCIAL HISTORY: Social History   Socioeconomic History  . Marital status: Single    Spouse name: Not on file  . Number of children: Not on file  . Years of education: Not on file  . Highest education level: Not on file  Occupational History  . Not on file  Tobacco Use  . Smoking status: Current Every Day Smoker    Types: Cigarettes  . Smokeless tobacco: Current User  . Tobacco comment: Smokes 2 cigarettes daily.  Substance and Sexual Activity  . Alcohol use: No    Alcohol/week: 0.0 standard drinks  . Drug use: Yes    Types: Marijuana    Comment: Smokes marijuana twice a week  . Sexual activity: Yes    Partners: Female    Birth control/protection: Condom    Comment: ninth grade; soccer; as of May 2012  Other Topics Concern  . Not on file  Social History Narrative   Patient lives with dad and baby in a one story.   Not working at this time.      Education: high school.     Right handed   Social Determinants of Health   Financial Resource Strain:   . Difficulty of Paying Living  Expenses:   Food Insecurity:   . Worried About Charity fundraiser in the Last Year:   . Arboriculturist in the Last Year:   Transportation Needs:   . Film/video editor (Medical):   Marland Kitchen Lack of Transportation (Non-Medical):   Physical Activity:   . Days of Exercise per Week:   . Minutes of Exercise per Session:   Stress:   . Feeling of Stress :   Social Connections:   . Frequency of Communication with Friends and Family:   . Frequency of Social Gatherings with Friends and Family:   . Attends Religious Services:   . Active Member of Clubs or Organizations:   . Attends Archivist Meetings:   Marland Kitchen Marital Status:   Intimate Partner Violence:   . Fear of Current or Ex-Partner:   . Emotionally  Abused:   Marland Kitchen Physically Abused:   . Sexually Abused:     REVIEW OF SYSTEMS: Constitutional: No fevers, chills, or sweats, no generalized fatigue, change in appetite Eyes: No visual changes, double vision, eye pain Ear, nose and throat: No hearing loss, ear pain, nasal congestion, sore throat Cardiovascular: No chest pain, palpitations Respiratory:  No shortness of breath at rest or with exertion, wheezes GastrointestinaI: No nausea, vomiting, diarrhea, abdominal pain, fecal incontinence Genitourinary:  No dysuria, urinary retention or frequency Musculoskeletal:  No neck pain, back pain Integumentary: No rash, pruritus, skin lesions Neurological: as above Psychiatric: No depression, insomnia, +anxiety Endocrine: No palpitations, fatigue, diaphoresis, mood swings, change in appetite, change in weight, increased thirst Hematologic/Lymphatic:  No anemia, purpura, petechiae. Allergic/Immunologic: no itchy/runny eyes, nasal congestion, recent allergic reactions, rashes  PHYSICAL EXAM: Vitals:   05/06/19 1207  BP: 137/75  Pulse: 89  SpO2: 100%   General: No acute distress Head:  Normocephalic/atraumatic Skin/Extremities: No rash, no edema Neurological Exam: alert and oriented to  person, place, and time. No aphasia or dysarthria. Fund of knowledge is appropriate.  Recent and remote memory are intact.  Attention and concentration are normal.   Cranial nerves: Pupils equal, round, reactive to light.  Fundoscopic exam unremarkable, no papilledema. Extraocular movements intact with no nystagmus. Visual fields full. No facial asymmetry. Motor: Bulk and tone normal, muscle strength 5/5 throughout with no pronator drift.Deep tendon reflexes +2 both UE, brisk +3 left patella, +2 right patella, no clonus or Hoffman sign. Finger to nose testing intact.  Gait narrow-based and steady, able to tandem walk adequately.   IMPRESSION: This is a 24 yo RH man with a history of juvenile absence epilepsy with absence seizures and generalized tonic-clonic seizures. Last GTC was 1.5 years ago due to medication issues. Longest seizure-free interval up to 3 years. He is overall doing well on Lamotrigine 400mg  daily but reports short-term memory issues, his father was reporting inability to complete train of thoughts. Proceed with 24-hour EEG to assess for seizure activity as cause of symptoms, we may adjust Lamotrigine dose if necessary. He was advised to start taking Lexapro 10mg  on a daily basis to see if this helps with sleep. He is not driving and is aware of [place] driving laws to stop driving after a seizure, until 6 months seizure-free. He will follow-up in 6 months or earlier if needed.    Thank you for allowing me to participate in his care.  Please do not hesitate to call for any questions or concerns.   , M.D.   CC: Dr. 

## 2019-07-12 ENCOUNTER — Encounter (HOSPITAL_COMMUNITY): Payer: Self-pay

## 2019-07-12 ENCOUNTER — Emergency Department (HOSPITAL_COMMUNITY): Payer: Self-pay

## 2019-07-12 ENCOUNTER — Emergency Department (HOSPITAL_COMMUNITY)
Admission: EM | Admit: 2019-07-12 | Discharge: 2019-07-12 | Disposition: A | Discharge status: Home / Self Care | Payer: Self-pay | Attending: Emergency Medicine | Admitting: Emergency Medicine

## 2019-07-12 ENCOUNTER — Other Ambulatory Visit: Payer: Self-pay

## 2019-07-12 DIAGNOSIS — Z79899 Other long term (current) drug therapy: Secondary | ICD-10-CM | POA: Insufficient documentation

## 2019-07-12 DIAGNOSIS — F17210 Nicotine dependence, cigarettes, uncomplicated: Secondary | ICD-10-CM | POA: Insufficient documentation

## 2019-07-12 DIAGNOSIS — K920 Hematemesis: Secondary | ICD-10-CM

## 2019-07-12 DIAGNOSIS — F1210 Cannabis abuse, uncomplicated: Secondary | ICD-10-CM | POA: Insufficient documentation

## 2019-07-12 DIAGNOSIS — R1012 Left upper quadrant pain: Secondary | ICD-10-CM | POA: Insufficient documentation

## 2019-07-12 DIAGNOSIS — R42 Dizziness and giddiness: Secondary | ICD-10-CM | POA: Insufficient documentation

## 2019-07-12 LAB — COMPREHENSIVE METABOLIC PANEL
ALT: 15 U/L (ref 0–44)
AST: 20 U/L (ref 15–41)
Albumin: 4.3 g/dL (ref 3.5–5.0)
Alkaline Phosphatase: 66 U/L (ref 38–126)
Anion gap: 11 (ref 5–15)
BUN: 13 mg/dL (ref 6–20)
CO2: 23 mmol/L (ref 22–32)
Calcium: 9.1 mg/dL (ref 8.9–10.3)
Chloride: 105 mmol/L (ref 98–111)
Creatinine, Ser: 1.22 mg/dL (ref 0.61–1.24)
GFR calc Af Amer: >60 mL/min (ref >60)
GFR calc non Af Amer: >60 mL/min (ref >60)
Glucose, Bld: 94 mg/dL (ref 70–99)
Potassium: 4 mmol/L (ref 3.5–5.1)
Sodium: 139 mmol/L (ref 135–145)
Total Bilirubin: 0.8 mg/dL (ref 0.3–1.2)
Total Protein: 7 g/dL (ref 6.5–8.1)

## 2019-07-12 LAB — CBC
HCT: 46 % (ref 39.0–52.0)
Hemoglobin: 15.4 g/dL (ref 13.0–17.0)
MCH: 31.8 pg (ref 26.0–34.0)
MCHC: 33.5 g/dL (ref 30.0–36.0)
MCV: 95 fL (ref 80.0–100.0)
Platelets: 233 10*3/uL (ref 150–400)
RBC: 4.84 MIL/uL (ref 4.22–5.81)
RDW: 12.2 % (ref 11.5–15.5)
WBC: 6 10*3/uL (ref 4.0–10.5)
nRBC: 0 % (ref 0.0–0.2)

## 2019-07-12 LAB — TYPE AND SCREEN
ABO/RH(D): A POS
Antibody Screen: NEGATIVE

## 2019-07-12 LAB — ABO/RH: ABO/RH(D): A POS

## 2019-07-12 MED ORDER — FAMOTIDINE 20 MG PO TABS
20.0000 mg | ORAL_TABLET | Freq: Two times a day (BID) | ORAL | 0 refills | Status: DC
Start: 2019-07-12 — End: 2020-06-16

## 2019-07-12 MED ORDER — FAMOTIDINE 20 MG PO TABS
20.0000 mg | ORAL_TABLET | Freq: Once | ORAL | Status: AC
  Administered 2019-07-12: 20 mg via ORAL
  Filled 2019-07-12: qty 1

## 2019-07-12 NOTE — ED Provider Notes (Signed)
[facility] EMERGENCY DEPARTMENT Provider Note   CSN: [id number] Arrival date & time: 07/12/19  1507     History Chief Complaint  Patient presents with   Hematemesis    Jesse Henry is a 24 y.o. male.  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  Patient states that he awoke around noon today and felt very lightheaded and nauseous.  He had an episode of vomiting which was mostly stomach acid however he noted a small amount of red blood at the end of vomiting.  He then threw up again approximately 2 hours later and noted a small amount of red blood again.  He presents the emergency department for evaluation.  He denies chest pain or shortness of breath.  He has mild left upper quadrant abdominal pain.  No fevers, full syncope.  No urinary symptoms.  No history of blood thinner use.  Denies black or tarry stools or red blood being passed in stool.  He denies heavy NSAID use or alcohol use.  He states that symptoms today feel different than a typical seizure prodrome.  He states that he eats a lot of greasy foods where he works at [employer].  No history of GERD or stomach ulcers.        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   H/O absence seizures 04/06/2011   Tonic clonic seizures (HCC) 04/06/2011    Past Surgical History:  Procedure Laterality Date   HAND SURGERY         Family  History  Problem Relation Age of Onset   Cancer Maternal Grandmother    Depression Maternal Grandmother    Early death Maternal Grandmother    Heart attack Maternal Grandmother        Died at the age of 69   Hypertension Paternal Grandmother    Early death Paternal Grandmother    Depression Paternal Grandfather    Cancer Maternal Aunt     Social History   Tobacco Use   Smoking status: Current Every Day Smoker    Types: Cigarettes   Smokeless tobacco: Current User   Tobacco comment: Smokes 2 cigarettes daily.  Vaping Use   Vaping Use: Never used  Substance Use Topics   Alcohol use: No    Alcohol/week: 0.0 standard drinks   Drug use: Yes    Types: Marijuana    Comment: Smokes marijuana twice a week    Home Medications Prior to Admission medications   Medication Sig Start Date End Date Taking? Authorizing Provider  acetaminophen (TYLENOL) 500 MG tablet Take 500 mg by mouth every 6 (six) hours as needed for mild pain or headache.   Yes [provider]  escitalopram (LEXAPRO) 10 MG tablet Take 1 tablet (10 mg total) by mouth daily. 05/06/19  Yes Van Clines, MD  lamoTRIgine (LAMICTAL) 200 MG tablet Take 2 tablets every morning Patient taking differently: Take 400 mg by mouth daily.  05/06/19  Yes Karel Jarvis,  Lesle Chris, MD  famotidine (PEPCID) 20 MG tablet Take 1 tablet (20 mg total) by mouth 2 (two) times daily. 07/12/19   Renne Crigler, PA-C  ibuprofen (ADVIL,MOTRIN) 600 MG tablet Take 1 tablet (600 mg total) by mouth every 8 (eight) hours as needed. Patient not taking: Reported on 07/12/2019 07/27/14   Jac Canavan, PA-C    Allergies    Dilantin [phenytoin sodium extended] and Other  Review of Systems   Review of Systems  Constitutional: Negative for fever.  HENT: Negative for rhinorrhea and sore throat.   Eyes: Negative for redness.  Respiratory: Negative for cough.   Cardiovascular: Negative for chest pain.  Gastrointestinal: Positive for nausea  and vomiting. Negative for abdominal pain, blood in stool and diarrhea.  Genitourinary: Negative for dysuria.  Musculoskeletal: Negative for myalgias.  Skin: Negative for rash.  Neurological: Positive for light-headedness. Negative for headaches.    Physical Exam Updated Vital Signs BP 119/78    Pulse 89    Temp 98.3 F (36.8 C) (Oral)    Resp 10    SpO2 99%   Physical Exam Vitals and nursing note reviewed.  Constitutional:      Appearance: He is well-developed.  HENT:     Head: Normocephalic and atraumatic.  Eyes:     General:        Right eye: No discharge.        Left eye: No discharge.     Conjunctiva/sclera: Conjunctivae normal.  Cardiovascular:     Rate and Rhythm: Normal rate and regular rhythm.     Heart sounds: Normal heart sounds.  Pulmonary:     Effort: Pulmonary effort is normal.     Breath sounds: Normal breath sounds.  Abdominal:     Palpations: Abdomen is soft.     Tenderness: There is abdominal tenderness (Mild) in the left upper quadrant. There is no guarding or rebound.  Musculoskeletal:     Cervical back: Normal range of motion and neck supple.  Skin:    General: Skin is warm and dry.  Neurological:     Mental Status: He is alert.     ED Results / Procedures / Treatments   Labs (all labs ordered are listed, but only abnormal results are displayed) Labs Reviewed  COMPREHENSIVE METABOLIC PANEL  CBC  POC OCCULT BLOOD, ED  TYPE AND SCREEN  ABO/RH    EKG None  Radiology DG Chest 2 View  Result Date: 07/12/2019 CLINICAL DATA:  Nausea, vomiting and dizziness. EXAM: CHEST - 2 VIEW COMPARISON:  May 13, 2014 FINDINGS: The heart size and mediastinal contours are within normal limits. Both lungs are clear. The visualized skeletal structures are unremarkable. IMPRESSION: No active cardiopulmonary disease. Electronically Signed   By: Aram Candela M.D.   On: 07/12/2019 19:49    Procedures Procedures (including critical care time)  Medications  Ordered in ED Medications  famotidine (PEPCID) tablet 20 mg (20 mg Oral Given 07/12/19 2039)    ED Course  I have reviewed the triage vital signs and the nursing notes.  Pertinent labs & imaging results that were available during my care of the patient were reviewed by me and considered in my medical decision making (see chart for details).  Patient seen and examined. Work-up reviewed.  Chest x-ray ordered and reviewed.  Patient work-up is completely normal.  He is drinking and has tolerated Pepcid.  Plan for discharge to home.  He will be placed on Pepcid twice daily.  Encourage PCP follow-up  and avoidance of greasy, spicy, or acidic foods or anything that makes his symptoms worse.  Discussed that he should return to the emergency department with worsening uncontrolled pain, trouble breathing or shortness of breath, persistent vomiting of blood or black stools.  Patient verbalizes understanding agrees with plan.  Vital signs reviewed and are as follows: BP 119/78    Pulse 89    Temp 98.3 F (36.8 C) (Oral)    Resp 10    SpO2 99%       MDM Rules/Calculators/A&P                          Patient with 2 episodes of vomiting with a small amount of red blood noted today.  He does have left upper quadrant abdominal pain which raises concern for gastritis or peptic ulcer disease.  Pain is mild without rebound or guarding.  Patient's white blood cell count and red blood cell count are normal.  Patient tachycardic on arrival to 108, however remainder of pulse checks have been normal.  Normal blood pressures.  Patient looks well.  No active bleeding.  He is tolerating p.o.'s.  Do not feel that further work-up is indicated at this time.  Will place on H2 blocker and have patient follow-up with his primary care doctor.  From a seizure standpoint, appears to be stable.    Final Clinical Impression(s) / ED Diagnoses Final diagnoses:  Hematemesis with nausea    Rx / DC Orders ED Discharge Orders          Ordered    famotidine (PEPCID) 20 MG tablet  2 times daily     Discontinue  Reprint     07/12/19 2044           Renne Crigler, PA-C 07/12/19 2052    Linwood Dibbles, MD 07/13/19 [phone]

## 2019-07-12 NOTE — ED Triage Notes (Signed)
Pt arrives to ED w/ c/o vomiting blood. Pt states he woke up w/ a headache and feeling dizzy and then had two episodes of vomiting blood this morning. Pt denies recent NSAID use.

## 2019-07-12 NOTE — Discharge Instructions (Signed)
Please read and follow all provided instructions.  Your diagnoses today include:  1. Hematemesis with nausea     Tests performed today include:  Blood counts and electrolytes  Blood tests to check kidney function  Chest x-ray - no problems  Vital signs. See below for your results today.   Medications prescribed:   Pepcid (famotidine) - antihistamine  You can find this medication over-the-counter.   DO NOT exceed:   20mg  Pepcid every 12 hours  Take any prescribed medications only as directed.  Home care instructions:   Follow any educational materials contained in this packet.  Follow-up instructions: Please follow-up with your primary care provider in the next 7 days for further evaluation of your symptoms.    Return instructions:  SEEK IMMEDIATE MEDICAL ATTENTION IF:  The pain does not go away or becomes severe   A temperature above 101F develops   Repeated vomiting occurs (multiple episodes)   The pain becomes localized to portions of the abdomen. The right side could possibly be appendicitis. In an adult, the left lower portion of the abdomen could be colitis or diverticulitis.   Greater amounts of blood is being passed in stools or vomit (bright red or black tarry stools)   You develop chest pain, difficulty breathing, dizziness or fainting, or become confused, poorly responsive, or inconsolable (young children)  If you have any other emergent concerns regarding your health  Your vital signs today were: BP 123/79   Pulse (!) 59   Temp 98.3 F (36.8 C) (Oral)   Resp 12   SpO2 99%  If your blood pressure (bp) was elevated above 135/85 this visit, please have this repeated by your doctor within one month. --------------

## 2019-09-06 ENCOUNTER — Other Ambulatory Visit: Payer: Self-pay | Admitting: Neurology

## 2019-09-06 DIAGNOSIS — G40A09 Absence epileptic syndrome, not intractable, without status epilepticus: Secondary | ICD-10-CM

## 2019-09-09 ENCOUNTER — Telehealth: Payer: Self-pay | Admitting: Neurology

## 2019-11-03 ENCOUNTER — Other Ambulatory Visit: Payer: Self-pay | Admitting: Neurology

## 2019-11-03 DIAGNOSIS — G40A09 Absence epileptic syndrome, not intractable, without status epilepticus: Secondary | ICD-10-CM

## 2019-11-12 ENCOUNTER — Encounter: Payer: Self-pay | Admitting: Neurology

## 2019-11-12 ENCOUNTER — Ambulatory Visit: Payer: Self-pay | Admitting: Neurology

## 2019-11-12 DIAGNOSIS — Z029 Encounter for administrative examinations, unspecified: Secondary | ICD-10-CM

## 2019-12-01 ENCOUNTER — Other Ambulatory Visit: Payer: Self-pay | Admitting: Neurology

## 2019-12-01 DIAGNOSIS — G40A09 Absence epileptic syndrome, not intractable, without status epilepticus: Secondary | ICD-10-CM

## 2019-12-30 ENCOUNTER — Other Ambulatory Visit: Payer: Self-pay

## 2019-12-30 ENCOUNTER — Telehealth: Payer: Self-pay | Admitting: Neurology

## 2019-12-30 ENCOUNTER — Other Ambulatory Visit: Payer: Self-pay | Admitting: Neurology

## 2019-12-30 DIAGNOSIS — G40A09 Absence epileptic syndrome, not intractable, without status epilepticus: Secondary | ICD-10-CM

## 2019-12-30 MED ORDER — LAMOTRIGINE 200 MG PO TABS: ORAL_TABLET | ORAL | 2 refills | Status: DC

## 2019-12-30 NOTE — Telephone Encounter (Signed)
Patient called in stating he needs a refill for his lamotrigine.

## 2019-12-30 NOTE — Telephone Encounter (Signed)
Refill sent in

## 2020-04-06 ENCOUNTER — Other Ambulatory Visit: Payer: Self-pay | Admitting: Neurology

## 2020-04-06 DIAGNOSIS — G40A09 Absence epileptic syndrome, not intractable, without status epilepticus: Secondary | ICD-10-CM

## 2020-04-07 NOTE — Telephone Encounter (Signed)
Patient called to check on his lamotrigine 200 MG refill. He only has enough for today.  [facility] [address]

## 2020-06-06 ENCOUNTER — Telehealth: Payer: Self-pay | Admitting: Neurology

## 2020-06-06 NOTE — Telephone Encounter (Signed)
I spoke with patient, patient is having dizziness x 3 days, couldn't look down, like he was going to fall. Feeling 100 percent better today, scared patient. Requesting sooner appt, please advise.

## 2020-06-06 NOTE — Telephone Encounter (Signed)
Patient is already on the wait list.

## 2020-06-06 NOTE — Telephone Encounter (Signed)
Patient called and requested a sooner appointment with Dr. Karel Jarvis. Currently scheduled on 09/05/20 with Dr. Karel Jarvis.  He said he is starting to feel some symptoms he is concerned about and needs to speak with a nurse.  Patient added to wait list.

## 2020-06-06 NOTE — Telephone Encounter (Signed)
Patient advised, can you add to waitlist thanks per Dr.Aquino

## 2020-06-06 NOTE — Telephone Encounter (Signed)
I will put him on waitlist. Does he have a PCP? It sounds like an inner ear event, would have him f/u with PCP as well. Thanks

## 2020-06-16 ENCOUNTER — Ambulatory Visit (INDEPENDENT_AMBULATORY_CARE_PROVIDER_SITE_OTHER): Payer: Self-pay | Admitting: Neurology

## 2020-06-16 ENCOUNTER — Other Ambulatory Visit (INDEPENDENT_AMBULATORY_CARE_PROVIDER_SITE_OTHER): Payer: Self-pay

## 2020-06-16 ENCOUNTER — Encounter: Payer: Self-pay | Admitting: Neurology

## 2020-06-16 ENCOUNTER — Other Ambulatory Visit: Payer: Self-pay

## 2020-06-16 VITALS — BP 114/72 | HR 76 | Ht 69.0 in | Wt 127.0 lb

## 2020-06-16 DIAGNOSIS — R413 Other amnesia: Secondary | ICD-10-CM

## 2020-06-16 DIAGNOSIS — G40A09 Absence epileptic syndrome, not intractable, without status epilepticus: Secondary | ICD-10-CM

## 2020-06-16 LAB — TSH: TSH: 2.22 u[IU]/mL (ref 0.35–4.50)

## 2020-06-16 LAB — VITAMIN B12: Vitamin B-12: 293 pg/mL (ref 211–911)

## 2020-06-16 MED ORDER — LAMOTRIGINE 200 MG PO TABS: ORAL_TABLET | ORAL | 11 refills | Status: DC

## 2020-06-16 NOTE — Patient Instructions (Signed)
Schedule 30-min EEG, then schedule 48-hour EEG  2. Bloodwork for TSH, B12  3. Continue Lamotrigine 200mg : take 2 tablets daily  4. If you start having the vertigo, try the Epley maneuver. If no improvement, call our office and we will send referral for Vestibular therapy  5. Follow-up in 6 months, call for any changes   Seizure Precautions: 1. If medication has been prescribed for you to prevent seizures, take it exactly as directed.  Do not stop taking the medicine without talking to your doctor first, even if you have not had a seizure in a long time.   2. Avoid activities in which a seizure would cause danger to yourself or to others.  Don't operate dangerous machinery, swim alone, or climb in high or dangerous places, such as on ladders, roofs, or girders.  Do not drive unless your doctor says you may.  3. If you have any warning that you may have a seizure, lay down in a safe place where you can't hurt yourself.    4.  No driving for 6 months from last seizure, as per [facility].   Please refer to the following link on the [employer]ile[employer] of [employer]'s website for more information: http://www.epilepsyfoundation.org/answerplace/Social/driving/drivingu.cfm   5.  Maintain good sleep hygiene. Avoid alcohol.  6.  Contact your doctor if you have any problems that may be related to the medicine you are taking.  7.  Call 911 and bring the patient back to the ED if:        A.  The seizure lasts longer than 5 minutes.       B.  The patient doesn't awaken shortly after the seizure  C.  The patient has new problems such as difficulty seeing, speaking or moving  D.  The patient was injured during the seizure  E.  The patient has a temperature over 102 F (39C)  F.  The patient vomited and now is having trouble breathing

## 2020-06-16 NOTE — Progress Notes (Signed)
NEUROLOGY FOLLOW UP OFFICE NOTE  Jesse Henry [id number] 05-Aug-1995  HISTORY OF PRESENT ILLNESS: I had the pleasure of seeing Jesse Henry in follow-up in the neurology clinic on 06/16/2020.  The patient was last seen over a year ago for epilepsy. He is on Lamotrigine 200mg  2 tabs daily (400mg  daily dose). He lives with his brother. He denies any seizures since he was incarcerated in 2020 and unable to get his medication. His brother and co-workers have not mentioned any staring spells, however he has times where he feels himself staring and then feels hungover and would stay home. Last episode was last month. He feels these occur when his body is getting worn out and he needs a day to recover. He has notices leg tremors, he is able to control them some and step on the brakes when he is driving. These occur a couple of times a month. They seem to occur when it is cold, his hips feel like they tighten up, he stretches his legs and the tremors would stop. There is no pain. If standing, he sits down and it passes in 10-15 seconds. No associated confusion. He denies any neck/back pain. He called our office to report dizziness where he could not look down, feeling like he would fall. He describes it as "the world spinning really bad." Last episode was a month ago. He has difficulty with sleep initiation, but when he sleeps, it would be for 10 hours. He works 5-10pm. He is asking is epilepsy can cause memory loss. He would be at work and forget what he was going to get. He denies getting lost driving. He denies missing medications. Most bills are on autopay. He smokes marijuana.    History on Initial Assessment 01/06/2014: This is a pleasant 25 yo RH man with a history of juvenile absence epilepsy. He started having staring spells at around age 25. His first generalized tonic-clonic seizure was at age 25. He denies any myoclonic jerks. His longest seizure-free interval is around 1 year. He had a generalized  tonic-clonic seizure last 12/27/13 provoked by sleep deprivation with his newborn child. Prior to this, his last seizure was in November 2014. He usually has a warning prior to the seizure, he would feel groggy, like his head is floating on a cloud, for several hours before the GTC. His father notes that his whole face sinks in, like has not slept in a while, he would be confused, asking the same question repeatedly, then would stare before going into a convulsion. If he is on the phone or Xbox, symptoms would get worse. He had been taking Lamotrigine 300mg /day with no side effects. He had been switched from immediate release to extended-release, with improvement in insomnia.   Prior AEDs: ethosuximide; dilantin; keppra  Epilepsy Risk Factors: His maternal aunt's daughter has seizures. Otherwise he had a normal birth and early development. No history of febrile convulsions, CNS infections, significant traumatic brain injury, neurosurgical procedures. He had a concussion with brief loss of consciousness after hitting his head on a soccer post.  Diagnostic Data: Prolonged nocturnal EEG at [facility] showed infrequent generalized polyspike and wave discharges while awake and runs a 2 to 3 Hz discharges lasting up to 2.5 seconds.  There is a solitary 6-second episode of 3.5 Hz spiking wave discharges over the left hemisphere best seen in the left temporal chain without clinical accompaniments.  During sleep his generalized spike wave activity increased in frequency whether it is less than  one polyspike in wave discharge every 10 seconds to more than one per minute.  He again had clusters at 2 to 3 Hz lasting 2.5 seconds and an occasional multiple single discharges within a 10-second period.  This was an ambulatory EEG.  I personally reviewed head CT without contrast done 03/2011 which was normal.  Last Lamictal level 04/16/13: 7.2, 07/18/14 3.6.  PAST MEDICAL HISTORY: Past Medical History:  Diagnosis  Date   Seizures (HCC)    Syncope and collapse     MEDICATIONS: Current Outpatient Medications on File Prior to Visit  Medication Sig Dispense Refill   acetaminophen (TYLENOL) 500 MG tablet Take 500 mg by mouth every 6 (six) hours as needed for mild pain or headache.     ibuprofen (ADVIL,MOTRIN) 600 MG tablet Take 1 tablet (600 mg total) by mouth every 8 (eight) hours as needed. 30 tablet 0   lamoTRIgine (LAMICTAL) 200 MG tablet TAKE 2 TABLETS BY MOUTH IN THE MORNING 60 tablet 4   No current facility-administered medications on file prior to visit.    ALLERGIES: Allergies  Allergen Reactions   Dilantin [Phenytoin Sodium Extended] Other (See Comments)    Childhood allergy   Other Swelling    Blue cheese     FAMILY HISTORY: Family History  Problem Relation Age of Onset   Cancer Maternal Grandmother    Depression Maternal Grandmother    Early death Maternal Grandmother    Heart attack Maternal Grandmother        Died at the age of 57   Hypertension Paternal Grandmother    Early death Paternal Grandmother    Depression Paternal Grandfather    Cancer Maternal Aunt     SOCIAL HISTORY: Social History   Socioeconomic History   Marital status: Single    Spouse name: Not on file   Number of children: Not on file   Years of education: Not on file   Highest education level: Not on file  Occupational History   Not on file  Tobacco Use   Smoking status: Every Day    Pack years: 0.00    Types: Cigarettes   Smokeless tobacco: Current   Tobacco comments:    Smokes 2 cigarettes daily.  Vaping Use   Vaping Use: Never used  Substance and Sexual Activity   Alcohol use: No    Alcohol/week: 0.0 standard drinks   Drug use: Yes    Types: Marijuana    Comment: Smokes marijuana twice a week   Sexual activity: Yes    Partners: Female    Birth control/protection: Condom    Comment: ninth grade; soccer; as of May 2012  Other Topics Concern   Not on file  Social History  Narrative   Patient lives with dad and baby in a one story.   Not working at this time.      Education: high school.     Right handed   Social Determinants of Health   Financial Resource Strain: Not on file  Food Insecurity: Not on file  Transportation Needs: Not on file  Physical Activity: Not on file  Stress: Not on file  Social Connections: Not on file  Intimate Partner Violence: Not on file     PHYSICAL EXAM: Vitals:   06/16/20 0916  BP: 114/72  Pulse: 76  SpO2: 97%   General: No acute distress Head:  Normocephalic/atraumatic Skin/Extremities: No rash, no edema Neurological Exam: alert and awake. No aphasia or dysarthria. Fund of knowledge is appropriate.  Attention  and concentration are normal.   Cranial nerves: Pupils equal, round. Extraocular movements intact with no nystagmus. Visual fields full.  No facial asymmetry.  Motor: Bulk and tone normal, muscle strength 5/5 throughout with no pronator drift. Sensation intact to all modalities on both UE and LE, reports localized decreased pin sensation above left hip region. Reflexes +2 throughout.  Finger to nose testing intact.  Gait narrow-based and steady, able to tandem walk adequately.  Romberg negative.   IMPRESSION: This is a 25 yo RH man with a history of juvenile absence epilepsy with absence seizures and generalized tonic-clonic seizures. Last GTC was in 2020. He reports leg tremors, none in office today. He also reports numbness in his left hip area, no back pain. He has been having intermittent vertigo and was given home Epley exercises to try at home, if no improvement, referral to Vestibular therapy will be sent. He continues to report memory changes, his father was previously reporting inability to complete train of thought. We will do an EEG and plan for 48-hour EEG to assess for subclinical seizures potentially causing cognitive issues. Also discussed how marijuana can affect memory. Check TSH and B12. Continue  Lamotrigine 200mg  2 tablets daily. He is aware of [place] driving laws to stop driving after a seizure until 6 months seizure-free. Follow-up in 6 months, call for any changes.    Thank you for allowing me to participate in his care.  Please do not hesitate to call for any questions or concerns.  , M.D.   CC: Dr. Patrcia Dolly

## 2020-06-27 ENCOUNTER — Other Ambulatory Visit: Payer: Self-pay

## 2020-09-05 ENCOUNTER — Ambulatory Visit: Payer: Self-pay | Admitting: Neurology

## 2020-12-27 ENCOUNTER — Ambulatory Visit: Payer: Self-pay | Admitting: Neurology

## 2020-12-27 ENCOUNTER — Encounter: Payer: Self-pay | Admitting: Neurology

## 2020-12-27 DIAGNOSIS — Z029 Encounter for administrative examinations, unspecified: Secondary | ICD-10-CM

## 2021-02-09 ENCOUNTER — Telehealth: Payer: Self-pay

## 2021-02-09 NOTE — Telephone Encounter (Signed)
Pt called he took a double dose of his lamotrigine per Dr Karel Jarvis he will be fine just stay home rest the dizziness will go away and tomorrow morning he can take his morning dose as scheduled, pt verbalized understanding,

## 2021-02-11 ENCOUNTER — Encounter (HOSPITAL_COMMUNITY): Payer: Self-pay | Admitting: Emergency Medicine

## 2021-02-11 ENCOUNTER — Other Ambulatory Visit: Payer: Self-pay

## 2021-02-11 ENCOUNTER — Emergency Department (HOSPITAL_COMMUNITY)
Admission: EM | Admit: 2021-02-11 | Discharge: 2021-02-11 | Disposition: A | Discharge status: Home / Self Care | Payer: Self-pay | Attending: Emergency Medicine | Admitting: Emergency Medicine

## 2021-02-11 DIAGNOSIS — R531 Weakness: Secondary | ICD-10-CM | POA: Insufficient documentation

## 2021-02-11 DIAGNOSIS — R42 Dizziness and giddiness: Secondary | ICD-10-CM | POA: Insufficient documentation

## 2021-02-11 DIAGNOSIS — R112 Nausea with vomiting, unspecified: Secondary | ICD-10-CM | POA: Insufficient documentation

## 2021-02-11 DIAGNOSIS — Z20822 Contact with and (suspected) exposure to covid-19: Secondary | ICD-10-CM | POA: Insufficient documentation

## 2021-02-11 LAB — RAPID URINE DRUG SCREEN, HOSP PERFORMED
Amphetamines: NOT DETECTED
Barbiturates: NOT DETECTED
Benzodiazepines: NOT DETECTED
Cocaine: NOT DETECTED
Opiates: NOT DETECTED
Tetrahydrocannabinol: POSITIVE — AB

## 2021-02-11 LAB — RESP PANEL BY RT-PCR (FLU A&B, COVID) ARPGX2
Influenza A by PCR: NEGATIVE
Influenza B by PCR: NEGATIVE
SARS Coronavirus 2 by RT PCR: NEGATIVE

## 2021-02-11 LAB — COMPREHENSIVE METABOLIC PANEL
ALT: 20 U/L (ref 0–44)
AST: 20 U/L (ref 15–41)
Albumin: 4.4 g/dL (ref 3.5–5.0)
Alkaline Phosphatase: 64 U/L (ref 38–126)
Anion gap: 8 (ref 5–15)
BUN: 11 mg/dL (ref 6–20)
CO2: 27 mmol/L (ref 22–32)
Calcium: 9.2 mg/dL (ref 8.9–10.3)
Chloride: 107 mmol/L (ref 98–111)
Creatinine, Ser: 1.24 mg/dL (ref 0.61–1.24)
GFR, Estimated: >60 mL/min (ref >60)
Glucose, Bld: 88 mg/dL (ref 70–99)
Potassium: 3.8 mmol/L (ref 3.5–5.1)
Sodium: 142 mmol/L (ref 135–145)
Total Bilirubin: 0.7 mg/dL (ref 0.3–1.2)
Total Protein: 7.2 g/dL (ref 6.5–8.1)

## 2021-02-11 LAB — URINALYSIS, ROUTINE W REFLEX MICROSCOPIC
Bilirubin Urine: NEGATIVE
Glucose, UA: NEGATIVE mg/dL
Hgb urine dipstick: NEGATIVE
Ketones, ur: NEGATIVE mg/dL
Leukocytes,Ua: NEGATIVE
Nitrite: NEGATIVE
Protein, ur: NEGATIVE mg/dL
Specific Gravity, Urine: 1.01 (ref 1.005–1.030)
pH: 6.5 (ref 5.0–8.0)

## 2021-02-11 LAB — LIPASE, BLOOD: Lipase: 21 U/L (ref 11–51)

## 2021-02-11 LAB — CBC WITH DIFFERENTIAL/PLATELET
Abs Immature Granulocytes: 0.03 10*3/uL (ref 0.00–0.07)
Basophils Absolute: 0 10*3/uL (ref 0.0–0.1)
Basophils Relative: 0 %
Eosinophils Absolute: 0.1 10*3/uL (ref 0.0–0.5)
Eosinophils Relative: 2 %
HCT: 44.6 % (ref 39.0–52.0)
Hemoglobin: 15.7 g/dL (ref 13.0–17.0)
Immature Granulocytes: 1 %
Lymphocytes Relative: 25 %
Lymphs Abs: 1.6 10*3/uL (ref 0.7–4.0)
MCH: 32.5 pg (ref 26.0–34.0)
MCHC: 35.2 g/dL (ref 30.0–36.0)
MCV: 92.3 fL (ref 80.0–100.0)
Monocytes Absolute: 0.3 10*3/uL (ref 0.1–1.0)
Monocytes Relative: 4 %
Neutro Abs: 4.4 10*3/uL (ref 1.7–7.7)
Neutrophils Relative %: 68 %
Platelets: 226 10*3/uL (ref 150–400)
RBC: 4.83 MIL/uL (ref 4.22–5.81)
RDW: 12.9 % (ref 11.5–15.5)
WBC: 6.5 10*3/uL (ref 4.0–10.5)
nRBC: 0 % (ref 0.0–0.2)

## 2021-02-11 LAB — CK: Total CK: 176 U/L (ref 49–397)

## 2021-02-11 MED ORDER — METHYLPREDNISOLONE 4 MG PO TBPK: ORAL_TABLET | Freq: Four times a day (QID) | ORAL | 0 refills | Status: AC

## 2021-02-11 MED ORDER — ONDANSETRON HCL 4 MG/2ML IJ SOLN
4.0000 mg | Freq: Once | INTRAMUSCULAR | Status: AC
  Administered 2021-02-11: 4 mg via INTRAVENOUS
  Filled 2021-02-11: qty 2

## 2021-02-11 MED ORDER — MECLIZINE HCL 25 MG PO TABS
25.0000 mg | ORAL_TABLET | Freq: Once | ORAL | Status: AC
  Administered 2021-02-11: 25 mg via ORAL
  Filled 2021-02-11: qty 1

## 2021-02-11 MED ORDER — MECLIZINE HCL 25 MG PO TABS: 25.0000 mg | ORAL_TABLET | Freq: Three times a day (TID) | ORAL | 0 refills | Status: DC | PRN

## 2021-02-11 MED ORDER — DIPHENHYDRAMINE HCL 25 MG PO TABS: 25.0000 mg | ORAL_TABLET | Freq: Four times a day (QID) | ORAL | 0 refills | Status: DC | PRN

## 2021-02-11 MED ORDER — SODIUM CHLORIDE 0.9 % IV BOLUS
1000.0000 mL | Freq: Once | INTRAVENOUS | Status: AC
  Administered 2021-02-11: 1000 mL via INTRAVENOUS

## 2021-02-11 NOTE — ED Notes (Signed)
Pt reports drug screens x 2 per month d/t probation and reports taking "detox" on Wednesday and his s/s got worse.

## 2021-02-11 NOTE — ED Notes (Addendum)
Pt reports he started his detox a week ago and was fine and then vomiting, lethargic, headache, photophobia and s/s got worse on Wednesday w/ dizziness. Pt reporting seizures fri sat

## 2021-02-11 NOTE — ED Provider Notes (Signed)
Care of patient handed off to me by PA Jodi Geralds.  Please see her note for full history, physical and work-up prior to handoff.  Briefly, this is a 26 year old male with history of seizures and marijuana use who presents to the emergency department with nausea, vomiting and decreased p.o. intake since Tuesday.  He states that he started feeling dizzy on Wednesday and then has had 3 seizures since, most recently last night.  Patient states due to the nausea and vomiting he was unable to take his Lamictal.  He states that he has tolerated his Lamictal since yesterday.  He states that the dizziness has been ongoing and is the most distressing thing to him at this time.   Physical Exam  BP (!) 150/60 (BP Location: Left Arm)    Pulse 95    Temp 98.8 F (37.1 C)    Resp 16    SpO2 100%   Physical Exam Vitals and nursing note reviewed.  Constitutional:      General: He is not in acute distress.    Appearance: Normal appearance.  HENT:     Head: Normocephalic and atraumatic.  Eyes:     General: No scleral icterus.    Extraocular Movements: Extraocular movements intact.  Pulmonary:     Effort: Pulmonary effort is normal. No respiratory distress.  Abdominal:     Palpations: Abdomen is soft.  Musculoskeletal:        General: Normal range of motion.     Cervical back: Neck supple.  Skin:    General: Skin is warm and dry.     Findings: No rash.  Neurological:     General: No focal deficit present.     Mental Status: He is alert and oriented to person, place, and time. Mental status is at baseline.  Psychiatric:        Mood and Affect: Mood normal.        Behavior: Behavior normal.        Thought Content: Thought content normal.        Judgment: Judgment normal.   Please see PA Jodi Geralds full physical exam.  Patient has neurovascularly intact.  There is no focal neurological deficits.  Radial nerves intact.  Strength intact.  Sensation intact. Procedures  Procedures  ED Course / MDM     Medical Decision Making Amount and/or Complexity of Data Reviewed Labs: ordered.  Risk OTC drugs. Prescription drug management.  Patient presents to the emergency department with nausea, vomiting, report of seizures and dizziness.  He has a history of seizures and is followed closely by neurology with Dr. Karel Jarvis.   Nausea, vomiting -Unclear etiology -Cannabis hyperemesis versus viral gastroenteritis versus detox pill that he was taking for cannabis -Nausea and vomiting under control here in the emergency department -Liter of IV fluids for dehydration -Patient tolerating p.o. at bedside.  Seizures -Underlying seizure disorder that is being followed by Dr. Karel Jarvis -Recent seizures likely breakthrough from inability to tolerate Lamictal due to nausea and vomiting over the past 2 days. -Currently taking his Lamictal as of yesterday.  Took his dose today.  States that he has plenty of medication at home to continue taking medication. -On my reassessment the patient is at neurological baseline.  There is no focal deficits.  He was observed here for 6 hours with no subsequent seizure-like activity.  When attempting to discharge the patient the father is requesting that patient have an EEG.  He states that he was sent here by  neurology specifically for EEG.  I see no communication with in epic regarding this.  I consulted neurology and spoke with Dr. Conchita Paris who recommends no EEG.  States that as long as he is tolerating p.o. and can take his Lamictal he can follow-up outpatient.  We discussed this with family who will see Dr. Karel Jarvis outpatient this week.  Dizziness -May be from dehydration, fluid resuscitated -He is complaining of right ear pain.  This may be a vestibular neuritis from viral infection causing acute vertigo. -Given meclizine with improvement in symptoms -Discussed with neuro who recommends meclizine, low-dose Benadryl, steroid.  Will order for discharge.  Overall the patient  is well-appearing and nontoxic in appearance.  He is tolerating p.o.  He is able to ambulate without assistance.  Discussed work-up and findings.  Discussed reasons for not doing EEG here in the emergency department.  Also discussed reasons for discharge versus admission.  Patient is stable for discharge.  Low concern for acute intercranial process as there has been no trauma to the head.  Doubt posterior circulation stroke with dizziness.  Patient will follow-up with neurology early this week for ongoing management of his seizures.  Regarding his dizziness I have prescribed him meclizine, Benadryl, Medrol Dosepak.  Patient is instructed to drink fluids, gentle diet.  Discussed return for worsening of symptoms.  He verbalized understanding.  Vital signs are stable.  He is safe for discharge.      Cristopher Peru, PA-C 02/11/21 2112    Ernie Avena, MD 02/11/21 2156

## 2021-02-11 NOTE — ED Notes (Signed)
Assumed pt care at this time

## 2021-02-11 NOTE — ED Notes (Signed)
Pt discharged and ambulated out of the ED without difficulty. 

## 2021-02-11 NOTE — ED Triage Notes (Signed)
Patient c/o emesis onset of Tuesday. States wednesday he was unable to keep anything down including water and his medicine and began having some dizziness. Patient worried he had a seizure in his sleep Thursday night. Reports light sensitivity. States he has been able to take him home meds yesterday and today.

## 2021-02-11 NOTE — Discharge Instructions (Addendum)
You were seen in the emergency department today for nausea, vomiting, dizziness.  While you were here we did a work-up which was reassuring.  You likely seized because you are unable to keep your lamotrigine down for a couple of days.  Please continue to take your lamotrigine as prescribed and do not change the dose until you are able to follow-up with your neurologist.  Additionally you likely have a viral section called vestibular neuritis which is causing your dizziness.  I prescribed you a antidizziness medication called meclizine.  You can take this as needed 3 times a day.  I am also prescribing a Benadryl which she can take as needed for the dizziness.  We will place you on a steroid taper over the next few days for the vestibular neuritis.  Please return to the emergency department if you are having repeated seizures that are uncontrollable or nausea and vomiting that is uncontrolled.  Please follow-up with your neurologist tomorrow.

## 2021-02-11 NOTE — ED Provider Notes (Signed)
[facility] EMERGENCY DEPARTMENT Provider Note   CSN: [id number] Arrival date & time: 02/11/21  1411     History  Chief Complaint  Patient presents with   Dizziness    Jesse Henry is a 26 y.o. male.  Jesse Henry is a 26 y.o. male with a history of seizures, who presents to the emergency department for evaluation of dizziness, nausea, vomiting and generalized weakness.  Patient reports about a week ago he started taking a detox pill that he thought from a local smoke shop to detox for lead so that he could passed his drug test for probation.  He reports that he took the pills over the course of 4 to 5 days and then on Tuesday started feeling progressively worse.  He reports persistent vomiting, unable to keep anything down and Wednesday started feeling dizzy which she describes as feeling like he is moving even when he is staying still.  He also reports he has had progressively worsening light sensitivity.  Tuesday and Wednesday he was unable to keep down his lamotrigine, and thinks he likely had seizures on Thursday and possibly last night.  Denies other drug use.  Was able to keep down his seizure medicine yesterday and today.  Reports feeling generally weak with some muscle cramping in his legs.  No other aggravating or alleviating factors.  The history is provided by the patient and a parent.      Home Medications Prior to Admission medications   Medication Sig Start Date End Date Taking? Authorizing Provider  acetaminophen (TYLENOL) 500 MG tablet Take 500 mg by mouth every 6 (six) hours as needed for mild pain or headache.    [provider]  ibuprofen (ADVIL,MOTRIN) 600 MG tablet Take 1 tablet (600 mg total) by mouth every 8 (eight) hours as needed. 07/27/14   Tysinger, Camelia Eng, PA-C  lamoTRIgine (LAMICTAL) 200 MG tablet TAKE 2 TABLETS BY MOUTH IN THE MORNING 06/16/20   Cameron Sprang, MD      Allergies    Dilantin [phenytoin sodium extended] and  Other    Review of Systems   Review of Systems  Constitutional:  Negative for chills and fever.  HENT: Negative.    Eyes:  Positive for photophobia. Negative for visual disturbance.  Respiratory:  Negative for cough and shortness of breath.   Cardiovascular:  Negative for chest pain.  Gastrointestinal:  Positive for nausea and vomiting. Negative for abdominal pain.  Genitourinary:  Negative for dysuria.  Musculoskeletal:  Positive for myalgias.  Neurological:  Positive for dizziness, seizures and weakness (Generalized). Negative for syncope, numbness and headaches.   Physical Exam Updated Vital Signs BP 123/90 (BP Location: Right Arm)    Pulse 81    Temp 98.8 F (37.1 C)    Resp 17    SpO2 100%  Physical Exam Vitals and nursing note reviewed.  Constitutional:      General: He is not in acute distress.    Appearance: Normal appearance. He is well-developed. He is not ill-appearing or diaphoretic.     Comments: Laying on stretcher comfortably with towel laid over his eyes, alert, does not appear to be in any acute distress  HENT:     Head: Normocephalic and atraumatic.     Mouth/Throat:     Mouth: Mucous membranes are moist.     Pharynx: Oropharynx is clear.  Eyes:     General:        Right eye: No discharge.  Left eye: No discharge.     Extraocular Movements: Extraocular movements intact.     Pupils: Pupils are equal, round, and reactive to light.  Cardiovascular:     Rate and Rhythm: Normal rate and regular rhythm.     Pulses: Normal pulses.     Heart sounds: Normal heart sounds. No murmur heard.   No friction rub. No gallop.  Pulmonary:     Effort: Pulmonary effort is normal. No respiratory distress.     Breath sounds: Normal breath sounds. No wheezing or rales.     Comments: Respirations equal and unlabored, patient able to speak in full sentences, lungs clear to auscultation bilaterally  Abdominal:     General: Bowel sounds are normal. There is no distension.      Palpations: Abdomen is soft. There is no mass.     Tenderness: There is no abdominal tenderness. There is no guarding.     Comments: Abdomen soft, nondistended, nontender to palpation in all quadrants without guarding or peritoneal signs  Musculoskeletal:        General: No deformity.     Cervical back: Neck supple.  Skin:    General: Skin is warm and dry.     Capillary Refill: Capillary refill takes less than 2 seconds.  Neurological:     Mental Status: He is alert and oriented to person, place, and time.     Coordination: Coordination normal.     Comments: Speech is clear, able to follow commands CN III-XII intact Normal strength in upper and lower extremities bilaterally including dorsiflexion and plantar flexion, strong and equal grip strength Sensation normal to light and sharp touch Moves extremities without ataxia, coordination intact  Psychiatric:        Mood and Affect: Mood normal.        Behavior: Behavior normal.    ED Results / Procedures / Treatments   Labs (all labs ordered are listed, but only abnormal results are displayed) Labs Reviewed  CBC WITH DIFFERENTIAL/PLATELET  URINALYSIS, ROUTINE W REFLEX MICROSCOPIC  COMPREHENSIVE METABOLIC PANEL  LIPASE, BLOOD  RAPID URINE DRUG SCREEN, HOSP PERFORMED  CK    EKG None  Radiology No results found.  Procedures Procedures    Medications Ordered in ED Medications  sodium chloride 0.9 % bolus 1,000 mL (has no administration in time range)  ondansetron (ZOFRAN) injection 4 mg (has no administration in time range)  meclizine (ANTIVERT) tablet 25 mg (has no administration in time range)    ED Course/ Medical Decision Making/ A&P                           26 y.o. male presents to the ED with complaints of dizziness, vomiting, generalized weakness after taking a week detox pill for several days from a local smoke shop, this involves an extensive number of treatment options, and is a complaint that carries with  it a high risk of complications and morbidity.  The differential diagnosis includes dehydration, electrolyte derangements, rhabdo, gastritis, vertigo  On arrival pt is nontoxic, vitals are within normal limits. Exam without significant findings patient with photophobia but no other neurologic deficits  Additional history obtained from father at bedside.  Outside records obtained and reviewed including previous neurology and ED encounters.  I ordered IV fluids, Zofran and meclizine.  Lab Tests:  I Ordered CBC, CMP, lipase, urinalysis, UDS and CK.  The majority of patient's labs are still pending but CBC without leukocytosis or  anemia and UA without signs of infection or significant dehydration.  Imaging Studies ordered:  Considered head CT but given patient with normal neurologic exam and known seizure history with no seizures today or noted head trauma do not feel that this would change management at this time.  Patient has had nausea and vomiting but with no abdominal tenderness do not feel he needs CT imaging of the abdomen.  ED Course:   Patient with nausea, vomiting, weakness and dizziness after taking a week detox pill that contains unknown substances.  Suspect the majority of his symptoms are due to dehydration from persistent vomiting in the setting of taking the substance.  Patient is well-appearing with normal vitals and no significant findings on exam.  At shift change lab work is pending, care signed out to PA Theodis Blaze who will follow up on additional lab work and reevaluate patient's symptoms.  Anticipate after rehydration he will likely be able to go home but will need to ambulate and tolerate p.o.    Portions of this note were generated with [employer]. Dictation errors may occur despite best attempts at proofreading.         Final Clinical Impression(s) / ED Diagnoses Final diagnoses:  None    Rx / DC Orders ED Discharge Orders     None          Janet Berlin 02/11/21 1623    Regan Lemming, MD 02/11/21 2053

## 2021-02-12 ENCOUNTER — Telehealth: Payer: Self-pay | Admitting: Neurology

## 2021-02-12 NOTE — Telephone Encounter (Signed)
Patient called and left a message stating he was told to contact the office to schedule an appointment for ED follow up.  Routing to clinical staff to advise on when to schedule him.

## 2021-02-12 NOTE — Telephone Encounter (Signed)
Pt called back in and left a message with the access nurse. He stated he went to the ED and they were not able to run an EKG and he is being released.

## 2021-02-12 NOTE — Telephone Encounter (Signed)
Ok for Feb 8 at 4pm, thanks

## 2021-02-12 NOTE — Telephone Encounter (Signed)
Pt called in on 02/11/21 at 12:48 PM and left a message with the Access Nurse. Pt stated he was dizzy, vomited on Monday, and had a seizure Wednesday, Thursday, and Friday. I have placed the entire Access Nurse message in Dr. Amparo Bristol box.

## 2021-02-14 ENCOUNTER — Other Ambulatory Visit: Payer: Self-pay

## 2021-02-14 ENCOUNTER — Encounter: Payer: Self-pay | Admitting: Neurology

## 2021-02-14 ENCOUNTER — Ambulatory Visit (INDEPENDENT_AMBULATORY_CARE_PROVIDER_SITE_OTHER): Payer: Self-pay | Admitting: Neurology

## 2021-02-14 VITALS — BP 118/69 | HR 108 | Ht 69.0 in | Wt 129.2 lb

## 2021-02-14 DIAGNOSIS — G40A09 Absence epileptic syndrome, not intractable, without status epilepticus: Secondary | ICD-10-CM

## 2021-02-14 MED ORDER — LAMOTRIGINE 200 MG PO TABS: ORAL_TABLET | ORAL | 11 refills | Status: DC

## 2021-02-14 NOTE — Patient Instructions (Addendum)
Continue Lamotrigine 200mg : take 2 tablets every morning  2. Schedule 1-hour EEG  3. Please call Dr. office to follow-up on ear infection  4. Follow-up in 6 months, call for any changes   Seizure Precautions: 1. If medication has been prescribed for you to prevent seizures, take it exactly as directed.  Do not stop taking the medicine without talking to your doctor first, even if you have not had a seizure in a long time.   2. Avoid activities in which a seizure would cause danger to yourself or to others.  Don't operate dangerous machinery, swim alone, or climb in high or dangerous places, such as on ladders, roofs, or girders.  Do not drive unless your doctor says you may.  3. If you have any warning that you may have a seizure, lay down in a safe place where you can't hurt yourself.    4.  No driving for 6 months from last seizure, as per [facility].   Please refer to the following link on the [employer]'s website for more information: http://www.epilepsyfoundation.org/answerplace/Social/driving/drivingu.cfm   5.  Maintain good sleep hygiene. Avoid alcohol.  6.  Contact your doctor if you have any problems that may be related to the medicine you are taking.  7.  Call 911 and bring the patient back to the ED if:        A.  The seizure lasts longer than 5 minutes.       B.  The patient doesn't awaken shortly after the seizure  C.  The patient has new problems such as difficulty seeing, speaking or moving  D.  The patient was injured during the seizure  E.  The patient has a temperature over 102 F (39C)  F.  The patient vomited and now is having trouble breathing

## 2021-02-14 NOTE — Progress Notes (Signed)
NEUROLOGY FOLLOW UP OFFICE NOTE  LACOREY NADEEM OS:[id number] 1995-06-30  HISTORY OF PRESENT ILLNESS: I had the pleasure of seeing Jesse Henry in follow-up in the neurology clinic on 02/14/2021.  The patient was last seen 8 months ago for epilepsy. He is accompanied by his mother who helps supplement the history today.  Records and images were personally reviewed where available.  Since his last visit, he has had an increase in seizures in the setting of infection. He was in the ER on 2/5 due to increased seizure frequency in the setting of dizziness, vomiting, and decreased PO intake. Per notes, he reported taking a detox pill so he could pass his drug test, he took the pills over the course of 4-5 days then started feeling worse with persistent vomiting, vertigo. He reported 3 seizures due to inability to take Lamictal. UDS positive for THC. He reports GTCs last Thursday, Friday, and Saturday (02/10/21) with associated tongue bite. All were nocturnal, no injuries, he was in bed. He was diagnosed with an ear infection and still gets dizzy when standing, using a cane for balance. He still has dizziness but no further nausea, able to keep down his medication. His mother has been staying with him the past few days and reports a change in character, he is a very quiet kid but since she got back, he cannot stop talking. This is noted in the office as well, he is more verbose compared to prior visits. He reports getting agitated easily with quick mood swings that he attributes to the meclizine. He is on Lamotrigine 200mg  2 tabs daily (400mg  daily) without side effects.    History on Initial Assessment 01/06/2014: This is a pleasant 26 yo RH man with a history of juvenile absence epilepsy. He started having staring spells at around age 58. His first generalized tonic-clonic seizure was at age 45. He denies any myoclonic jerks. His longest seizure-free interval is around 1 year. He had a generalized tonic-clonic  seizure last 12/27/13 provoked by sleep deprivation with his newborn child. Prior to this, his last seizure was in November 2014. He usually has a warning prior to the seizure, he would feel groggy, like his head is floating on a cloud, for several hours before the GTC. His father notes that his whole face sinks in, like has not slept in a while, he would be confused, asking the same question repeatedly, then would stare before going into a convulsion. If he is on the phone or Xbox, symptoms would get worse. He had been taking Lamotrigine 300mg /day with no side effects. He had been switched from immediate release to extended-release, with improvement in insomnia.   Prior AEDs: ethosuximide; dilantin; keppra  Epilepsy Risk Factors: His maternal aunt's daughter has seizures. Otherwise he had a normal birth and early development. No history of febrile convulsions, CNS infections, significant traumatic brain injury, neurosurgical procedures. He had a concussion with brief loss of consciousness after hitting his head on a soccer post.  Diagnostic Data: Prolonged nocturnal EEG at [facility] showed infrequent generalized polyspike and wave discharges while awake and runs a 2 to 3 Hz discharges lasting up to 2.5 seconds.  There is a solitary 6-second episode of 3.5 Hz spiking wave discharges over the left hemisphere best seen in the left temporal chain without clinical accompaniments.  During sleep his generalized spike wave activity increased in frequency whether it is less than one polyspike in wave discharge every 10 seconds to more than one per  minute.  He again had clusters at 2 to 3 Hz lasting 2.5 seconds and an occasional multiple single discharges within a 10-second period.  This was an ambulatory EEG.  I personally reviewed head CT without contrast done 03/2011 which was normal.  Last Lamictal level 04/16/13: 7.2, 07/18/14 3.6.  PAST MEDICAL HISTORY: Past Medical History:  Diagnosis Date   Seizures  ([place])    Syncope and collapse     MEDICATIONS: Current Outpatient Medications on File Prior to Visit  Medication Sig Dispense Refill   acetaminophen (TYLENOL) 500 MG tablet Take 500 mg by mouth every 6 (six) hours as needed for mild pain or headache.     diphenhydrAMINE (BENADRYL) 25 MG tablet Take 1 tablet (25 mg total) by mouth every 6 (six) hours as needed. 30 tablet 0   ibuprofen (ADVIL,MOTRIN) 600 MG tablet Take 1 tablet (600 mg total) by mouth every 8 (eight) hours as needed. 30 tablet 0   lamoTRIgine (LAMICTAL) 200 MG tablet TAKE 2 TABLETS BY MOUTH IN THE MORNING 60 tablet 11   meclizine (ANTIVERT) 25 MG tablet Take 1 tablet (25 mg total) by mouth 3 (three) times daily as needed for dizziness. 30 tablet 0   methylPREDNISolone (MEDROL DOSEPAK) 4 MG TBPK tablet Take by mouth taper from 4 doses each day to 1 dose and stop for 6 days. Taper over 6 days 21 tablet 0   No current facility-administered medications on file prior to visit.    ALLERGIES: Allergies  Allergen Reactions   Dilantin [Phenytoin Sodium Extended] Other (See Comments)    Childhood allergy   Other Swelling    Blue cheese     FAMILY HISTORY: Family History  Problem Relation Age of Onset   Cancer Maternal Grandmother    Depression Maternal Grandmother    Early death Maternal Grandmother    Heart attack Maternal Grandmother        Died at the age of 53   Hypertension Paternal Grandmother    Early death Paternal Grandmother    Depression Paternal Grandfather    Cancer Maternal Aunt     SOCIAL HISTORY: Social History   Socioeconomic History   Marital status: Single    Spouse name: Not on file   Number of children: Not on file   Years of education: Not on file   Highest education level: Not on file  Occupational History   Not on file  Tobacco Use   Smoking status: Every Day    Types: Cigarettes   Smokeless tobacco: Current   Tobacco comments:    Smokes 2 cigarettes daily.  Vaping Use   Vaping  Use: Never used  Substance and Sexual Activity   Alcohol use: No    Alcohol/week: 0.0 standard drinks   Drug use: Yes    Types: Marijuana    Comment: Smokes marijuana twice a week   Sexual activity: Yes    Partners: Female    Birth control/protection: Condom    Comment: ninth grade; soccer; as of May 2012  Other Topics Concern   Not on file  Social History Narrative   Patient lives with dad and baby in a one story.   Not working at this time.      Education: high school.     Right handed   Social Determinants of Health   Financial Resource Strain: Not on file  Food Insecurity: Not on file  Transportation Needs: Not on file  Physical Activity: Not on file  Stress: Not on  file  Social Connections: Not on file  Intimate Partner Violence: Not on file     PHYSICAL EXAM: Vitals:   02/14/21 1554  BP: 118/69  Pulse: (!) 108  SpO2: 99%   General: No acute distress Head:  Normocephalic/atraumatic Skin/Extremities: No rash, no edema Neurological Exam: alert and awakee. No aphasia or dysarthria. Fund of knowledge is appropriate.  Attention and concentration are normal but with verbose speech.  Cranial nerves: Pupils equal, round. Extraocular movements intact with no nystagmus. Visual fields full.  No facial asymmetry.  Motor: Bulk and tone normal, muscle strength 5/5 throughout with no pronator drift.   Finger to nose testing intact.  Gait narrow-based and steady, able to tandem walk adequately.  Romberg negative.   IMPRESSION: This is a 26 yo RH man with a history of juvenile absence epilepsy with absence seizures and generalized tonic-clonic seizures. He had been seizure-free for 3 years until several seizures in the past week in the setting of vomiting/ear infection. Last seizure was on 02/10/2021, he is now able to keep down medications. His mother reports a change in personality, family had previously reported inability to complete his train of thought. We discussed doing EEG as  previously planned, continue Lamotrigine 200mg  2 tabs every morning for now. He is aware of [place] driving laws to stop driving after a seizure until 6 months seizure-free. Follow-up with PCP for ear infection. Follow-up in 6 months, call for any changes.    Thank you for allowing me to participate in his care.  Please do not hesitate to call for any questions or concerns.    Ellouise Newer, M.D.   CC: Dr. Redmond School

## 2021-03-08 ENCOUNTER — Ambulatory Visit (INDEPENDENT_AMBULATORY_CARE_PROVIDER_SITE_OTHER): Payer: Self-pay | Admitting: Neurology

## 2021-03-08 ENCOUNTER — Other Ambulatory Visit: Payer: Self-pay

## 2021-03-08 DIAGNOSIS — G40A09 Absence epileptic syndrome, not intractable, without status epilepticus: Secondary | ICD-10-CM

## 2021-03-13 NOTE — Procedures (Signed)
ELECTROENCEPHALOGRAM REPORT ? ?Date of Study: 03/08/2021 ? ?Patient's Name: Jesse Henry ?MRN: [id number] ?Date of Birth: 05/12/1995 ? ?Referring Provider: Dr. Ellouise Newer ? ?Clinical History: This is a 26 year old man with a history of JME with recent cluster of seizures, now with personality changes. EEG for classification. ? ?Medications: ?LAMICTAL 200 MG tablet ?BENADRYL 25 MG tablet ?ADVIL,MOTRIN 600 MG tablet ?ANTIVERT 25 MG tablet ?MEDROL DOSEPAK 4 MG TBPK tablet ? ?Technical Summary: ?A multichannel digital 1-hour EEG recording measured by the international 10-20 system with electrodes applied with paste and impedances below 5000 ohms performed in our laboratory with EKG monitoring in an awake and asleep patient.  Hyperventilation was not performed. Photic stimulation was performed.  The digital EEG was referentially recorded, reformatted, and digitally filtered in a variety of bipolar and referential montages for optimal display.   ? ?Description: ?The patient is awake and asleep during the recording.  During maximal wakefulness, there is a symmetric, medium voltage 10 Hz posterior dominant rhythm that attenuates with eye opening.  The record is symmetric.  During drowsiness and sleep, there is an increase in theta slowing of the background.  Vertex waves and symmetric sleep spindles were seen.  Photic stimulation did not elicit any abnormalities. There were frequent bursts of 3-4 Hz polyspike and wave discharges with frontal predominance lasting 0.5 to 1.5 seconds with no clinical changes seen. There were no electrographic seizures seen.   ? ?EKG lead was unremarkable. ? ?Impression: ?This 1-hour awake and asleep EEG is abnormal due to the presence of frequent generalized 3-4 Hz polyspike and wave discharges. ? ? ?Clinical Correlation of the above findings is consistent with a primary generalized epilepsy. If further clinical questions remain, prolonged EEG may be helpful.  Clinical correlation is  advised. ? ? ?Ellouise Newer, M.D. ? ?

## 2021-04-06 ENCOUNTER — Telehealth: Payer: Self-pay | Admitting: Neurology

## 2021-04-06 NOTE — Telephone Encounter (Signed)
Is he having any seizures or just the vertigo? His vertigo was due to an ear infection, has he seen his PCP to make sure there is no recurrence? We can check a Lamictal level, but would see PCP for ear exam first, thanks ?

## 2021-04-06 NOTE — Telephone Encounter (Signed)
Pt called no answer left a voice mail to call the office back  °

## 2021-04-06 NOTE — Telephone Encounter (Signed)
The following detailed message was left with [facility] on 04/06/21 at 12:39PM. ? ?Caller says he was seen for veritgo, vomiting and seizures about a month ago. The vertigo has come back.  ? ?Spoke with triage nurse and was told to see  ?PCP within 3 days. See detailed report in provider's inbox. ?

## 2021-04-06 NOTE — Telephone Encounter (Signed)
Pt stated that it was just vertigo no signs of any seizures, he will call his PCP to have his ears check.  ?

## 2021-04-12 ENCOUNTER — Encounter: Payer: Self-pay | Admitting: Physician Assistant

## 2021-04-12 ENCOUNTER — Ambulatory Visit (INDEPENDENT_AMBULATORY_CARE_PROVIDER_SITE_OTHER): Payer: Self-pay | Admitting: Physician Assistant

## 2021-04-12 VITALS — BP 120/80 | HR 92 | Ht 69.0 in | Wt 136.8 lb

## 2021-04-12 DIAGNOSIS — R42 Dizziness and giddiness: Secondary | ICD-10-CM

## 2021-04-12 DIAGNOSIS — H6593 Unspecified nonsuppurative otitis media, bilateral: Secondary | ICD-10-CM

## 2021-04-12 DIAGNOSIS — H6503 Acute serous otitis media, bilateral: Secondary | ICD-10-CM

## 2021-04-12 DIAGNOSIS — J309 Allergic rhinitis, unspecified: Secondary | ICD-10-CM

## 2021-04-12 MED ORDER — AZITHROMYCIN 250 MG PO TABS: ORAL_TABLET | ORAL | 0 refills | Status: AC

## 2021-04-12 MED ORDER — METHYLPREDNISOLONE 4 MG PO TBPK: ORAL_TABLET | Freq: Every day | ORAL | 0 refills | Status: DC

## 2021-04-12 NOTE — Patient Instructions (Addendum)
You can take an OTC expectorant like guaifenesin or Mucinex to help decrease head and nasal congestion.   ? ?For nasal congestion and post nasal drip you can use an OTC nasal saline rinse as well as one of the OTC nasal steroids (generic equivalent) like Flonase (Fluticasone) or Rhinocort (Budesonide) or Nasacort (Triamcinolone) or Nasonex (Mometasone Furoate).  ? ?You can take an over the counter antihistamine to help with allergic rhinitis / itching / hives: NON-DROWSY Allegra (Fexofenadine) 180 mg daily or NON-Drowsy Claritin (Loratidine) 10 mg daily  or DROWSY Benadryl (Diphenhydramine) 25 mg as directed or Zyrtec (Cetirizine) 10 mg daily. You can go to a store with a pharmacy and ask them to help you find these medicines. ? ?You will get a call to schedule an appointment with ENT. ? ? ? ?

## 2021-04-12 NOTE — Assessment & Plan Note (Signed)
You can take an over the counter antihistamine to help with allergic rhinitis / itching / hives: NON-DROWSY Allegra (Fexofenadine) 180 mg daily or NON-Drowsy Claritin (Loratidine) 10 mg daily  or DROWSY Benadryl (Diphenhydramine) 25 mg as directed or Zyrtec (Cetirizine) 10 mg daily. You can go to a store with a pharmacy and ask them to help you find these medicines. ?For nasal congestion and post nasal drip you can use an OTC nasal saline rinse as well as one of the OTC nasal steroids (generic equivalent) like Flonase (Fluticasone) or Rhinocort (Budesonide) or Nasacort (Triamcinolone) or Nasonex (Mometasone Furoate).  ?You can take an OTC expectorant like guaifenesin or Mucinexto help decrease head and nasal congestion.  ? ?

## 2021-04-12 NOTE — Progress Notes (Signed)
? ?Acute Office Visit ? ?Subjective:  ? ? Patient ID: Jesse Henry, male    DOB: 12/04/1995, 26 y.o.   MRN: [id number] ? ?Chief Complaint  ?Patient presents with  ? Acute Visit  ?  Left ear pain that caused vertigo. Pt will need a work note  ? ? ?HPI ?Patient is in today for 1 week of left ear pain, decreased hearing of left ear, and "vertigo"; states this actually started 2 months ago with severe dizziness and reports he went to the ED for evaluation and states he was treated for an "ear infection and vertigo"; per chart review he [facility] and diagnosed with dizziness (differential dx vestibular neuritis from viral infection causing acute vertigo) and treated with Benadryl, Meclizine, and a Dosepak after discussion with his Neurologist; he states the Dospak was helpful, but now his ear problems have returned; denies headache; has been sneezing and has a history of allergic rhinitis; denies taking medicine for allergic rhinitis; denies recent travel; no sick contacts;  denies fever / chills / nausea / vomiting / diarrhea / constipation; right now denies dizziness and denies the room spinning; 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 ? ? ? ?Outpatient Medications Prior to Visit  ?Medication Sig Dispense Refill  ? acetaminophen (TYLENOL) 500 MG tablet Take 500 mg by mouth every 6 (six) hours as needed for mild pain or headache.    ? ibuprofen (ADVIL,MOTRIN) 600 MG tablet Take 1 tablet (600 mg total) by mouth every 8 (eight) hours as needed. 30 tablet 0  ? lamoTRIgine (LAMICTAL) 200 MG tablet TAKE 2 TABLETS BY MOUTH IN THE MORNING 60 tablet 11  ? meclizine (ANTIVERT) 25 MG tablet Take 1 tablet (25 mg total) by mouth 3 (three) times daily as needed for dizziness. 30 tablet 0  ? diphenhydrAMINE (BENADRYL) 25 MG tablet Take 1 tablet (25 mg total) by mouth every 6 (six) hours as needed. (Patient  not taking: Reported on 04/12/2021) 30 tablet 0  ? ?No facility-administered medications prior to visit.  ? ? ?Allergies  ?Allergen Reactions  ? Dilantin [Phenytoin Sodium Extended] Other (See Comments)  ?  Childhood allergy  ? Other Swelling  ?  Blue cheese ?  ? ? ?Review of Systems  ?Constitutional:  Negative for activity change, chills, fatigue and fever.  ?HENT:  Positive for ear pain, hearing loss, postnasal drip, rhinorrhea, sinus pressure and sneezing. Negative for congestion, ear discharge, sinus pain, sore throat and voice change.   ?Eyes:  Negative for pain and redness.  ?Respiratory:  Negative for cough and shortness of breath.   ?Cardiovascular:  Negative for leg swelling.  ?Gastrointestinal:  Negative for constipation, diarrhea, nausea and vomiting.  ?Endocrine: Negative for polyuria.  ?Genitourinary:  Negative for flank pain and frequency.  ?Musculoskeletal:  Negative for joint swelling and neck pain.  ?Skin:  Negative for rash.  ?Neurological:  Positive for light-headedness. Negative for dizziness.  ?Hematological:  Does not bruise/bleed easily.  ?Psychiatric/Behavioral:  Negative for agitation and behavioral problems.   ? ?   ?Objective:  ?  ?Physical Exam ?Vitals and nursing note reviewed.  ?Constitutional:   ?   General: He is not in acute distress. ?   Appearance: Normal appearance.  ?HENT:  ?   Head: Normocephalic and atraumatic.  ?   Right Ear: External ear normal. A middle ear effusion  is present. There is no impacted cerumen. Tympanic membrane is injected.  ?   Left Ear: External ear normal. A middle ear effusion is present. There is no impacted cerumen. Tympanic membrane is injected.  ?   Nose: No congestion.  ?Eyes:  ?   Extraocular Movements: Extraocular movements intact.  ?   Conjunctiva/sclera: Conjunctivae normal.  ?   Pupils: Pupils are equal, round, and reactive to light.  ?Cardiovascular:  ?   Rate and Rhythm: Normal rate and regular rhythm.  ?   Pulses: Normal pulses.  ?   Heart  sounds: Normal heart sounds.  ?Pulmonary:  ?   Effort: Pulmonary effort is normal.  ?   Breath sounds: Normal breath sounds. No wheezing.  ?Abdominal:  ?   General: Bowel sounds are normal.  ?   Palpations: Abdomen is soft.  ?Musculoskeletal:     ?   General: Normal range of motion.  ?   Cervical back: Normal range of motion and neck supple.  ?   Right lower leg: No edema.  ?   Left lower leg: No edema.  ?Skin: ?   General: Skin is warm and dry.  ?   Findings: No rash.  ?Neurological:  ?   Mental Status: He is alert and oriented to person, place, and time.  ?   Gait: Gait normal.  ?Psychiatric:     ?   Mood and Affect: Mood normal.     ?   Behavior: Behavior normal.  ? ? ?Pulse 92   Wt 136 lb 12.8 oz (62.1 kg)   SpO2 100%   BMI 20.20 kg/m?  ? ?BP Readings from Last 10 Encounters:  ?02/14/21 118/69  ?02/11/21 102/66  ?06/16/20 114/72  ?07/12/19 121/73  ?05/06/19 137/75  ?09/27/16 120/67  ?03/19/16 106/70  ?03/15/15 104/70  ?01/06/15 100/60  ?10/24/14 116/64  ? ? ? ?Wt Readings from Last 3 Encounters:  ?04/12/21 136 lb 12.8 oz (62.1 kg)  ?02/14/21 129 lb 3.2 oz (58.6 kg)  ?06/16/20 127 lb (57.6 kg)  ? ? ?Results for orders placed or performed during the hospital encounter of 02/11/21  ?Resp Panel by RT-PCR (Flu A&B, Covid) Nasopharyngeal Swab  ? Specimen: Nasopharyngeal Swab; Nasopharyngeal(NP) swabs in vial transport medium  ?Result Value Ref Range  ? SARS Coronavirus 2 by RT PCR NEGATIVE NEGATIVE  ? Influenza A by PCR NEGATIVE NEGATIVE  ? Influenza B by PCR NEGATIVE NEGATIVE  ?Comprehensive metabolic panel  ?Result Value Ref Range  ? Sodium 142 135 - 145 mmol/L  ? Potassium 3.8 3.5 - 5.1 mmol/L  ? Chloride 107 98 - 111 mmol/L  ? CO2 27 22 - 32 mmol/L  ? Glucose, Bld 88 70 - 99 mg/dL  ? BUN 11 6 - 20 mg/dL  ? Creatinine, Ser 1.24 0.61 - 1.24 mg/dL  ? Calcium 9.2 8.9 - 10.3 mg/dL  ? Total Protein 7.2 6.5 - 8.1 g/dL  ? Albumin 4.4 3.5 - 5.0 g/dL  ? AST 20 15 - 41 U/L  ? ALT 20 0 - 44 U/L  ? Alkaline Phosphatase 64  38 - 126 U/L  ? Total Bilirubin 0.7 0.3 - 1.2 mg/dL  ? GFR, Estimated >60 >60 mL/min  ? Anion gap 8 5 - 15  ?Lipase, blood  ?Result Value Ref Range  ? Lipase 21 11 - 51 U/L  ?CBC with Differential  ?Result Value Ref Range  ? WBC 6.5 4.0 - 10.5 K/uL  ? RBC 4.83 4.22 - 5.81 MIL/uL  ?  Hemoglobin 15.7 13.0 - 17.0 g/dL  ? HCT 44.6 39.0 - 52.0 %  ? MCV 92.3 80.0 - 100.0 fL  ? MCH 32.5 26.0 - 34.0 pg  ? MCHC 35.2 30.0 - 36.0 g/dL  ? RDW 12.9 11.5 - 15.5 %  ? Platelets 226 150 - 400 K/uL  ? nRBC 0.0 0.0 - 0.2 %  ? Neutrophils Relative % 68 %  ? Neutro Abs 4.4 1.7 - 7.7 K/uL  ? Lymphocytes Relative 25 %  ? Lymphs Abs 1.6 0.7 - 4.0 K/uL  ? Monocytes Relative 4 %  ? Monocytes Absolute 0.3 0.1 - 1.0 K/uL  ? Eosinophils Relative 2 %  ? Eosinophils Absolute 0.1 0.0 - 0.5 K/uL  ? Basophils Relative 0 %  ? Basophils Absolute 0.0 0.0 - 0.1 K/uL  ? Immature Granulocytes 1 %  ? Abs Immature Granulocytes 0.03 0.00 - 0.07 K/uL  ?Urinalysis, Routine w reflex microscopic Urine, Clean Catch  ?Result Value Ref Range  ? Color, Urine YELLOW YELLOW  ? APPearance CLEAR CLEAR  ? Specific Gravity, Urine 1.010 1.005 - 1.030  ? pH 6.5 5.0 - 8.0  ? Glucose, UA NEGATIVE NEGATIVE mg/dL  ? Hgb urine dipstick NEGATIVE NEGATIVE  ? Bilirubin Urine NEGATIVE NEGATIVE  ? Ketones, ur NEGATIVE NEGATIVE mg/dL  ? Protein, ur NEGATIVE NEGATIVE mg/dL  ? Nitrite NEGATIVE NEGATIVE  ? Leukocytes,Ua NEGATIVE NEGATIVE  ?Urine rapid drug screen (hosp performed)  ?Result Value Ref Range  ? Opiates NONE DETECTED NONE DETECTED  ? Cocaine NONE DETECTED NONE DETECTED  ? Benzodiazepines NONE DETECTED NONE DETECTED  ? Amphetamines NONE DETECTED NONE DETECTED  ? Tetrahydrocannabinol POSITIVE (A) NONE DETECTED  ? Barbiturates NONE DETECTED NONE DETECTED  ?CK  ?Result Value Ref Range  ? Total CK 176 49 - 397 U/L  ? ? ?   ?Assessment & Plan:  ?1. Dizziness ?- Ambulatory referral to ENT ? ?2. Non-recurrent acute serous otitis media of both ears ?- Ambulatory referral to ENT ? ?3.  Allergic rhinitis, mild ?- Ambulatory referral to ENT ? ?P:  ?Zpak and medrol dosepak, referral to ENT ? ?Increase clear fluids, You can take an over the counter antihistamine to help with allergic rhinitis / it

## 2021-04-30 ENCOUNTER — Telehealth: Payer: Self-pay | Admitting: Neurology

## 2021-04-30 NOTE — Telephone Encounter (Signed)
Pt called no answer left a voice mail to call back so we can follow up on how he is doing? And to see if you know how to take is medication it is TID he can take it every 8 hours,   ?

## 2021-04-30 NOTE — Telephone Encounter (Signed)
Patient LM with AN 04/27/21 @ 5:24pm he took 25mg  meclizine but he still has vertigo. Wants to know if he can take another.  ?

## 2021-08-14 ENCOUNTER — Encounter: Payer: Self-pay | Admitting: Family Medicine

## 2021-08-14 NOTE — Progress Notes (Deleted)
   Complete physical exam  Patient: Jesse Henry   DOB: July 23, 1995   25 y.o. Male  MRN: [id number]  Subjective:    No chief complaint on file.   Jesse Henry is a 26 y.o. male who presents today for a complete physical exam. He reports consuming a {diet types:17450} diet. {types:19826} He generally feels {DESC; WELL/FAIRLY WELL/POORLY:18703}. He reports sleeping {DESC; WELL/FAIRLY WELL/POORLY:18703}. He {does/does not:200015} have additional problems to discuss today.    Most recent fall risk assessment:    04/12/2021    3:03 PM  Fall Risk   Falls in the past year? 0  Number falls in past yr: 0  Injury with Fall? 0  Risk for fall due to : No Fall Risks  Follow up Falls evaluation completed     Most recent depression screenings:    04/12/2021    3:03 PM  PHQ 2/9 Scores  PHQ - 2 Score 0    {VISON DENTAL STD PSA (Optional):27386}  {History (Optional):23778}  Patient Care Team: Ronnald Nian, MD as PCP - General (Family Medicine) Van Clines, MD as Consulting Physician (Neurology)   Outpatient Medications Prior to Visit  Medication Sig   acetaminophen (TYLENOL) 500 MG tablet Take 500 mg by mouth every 6 (six) hours as needed for mild pain or headache.   diphenhydrAMINE (BENADRYL) 25 MG tablet Take 1 tablet (25 mg total) by mouth every 6 (six) hours as needed. (Patient not taking: Reported on 04/12/2021)   ibuprofen (ADVIL,MOTRIN) 600 MG tablet Take 1 tablet (600 mg total) by mouth every 8 (eight) hours as needed.   lamoTRIgine (LAMICTAL) 200 MG tablet TAKE 2 TABLETS BY MOUTH IN THE MORNING   meclizine (ANTIVERT) 25 MG tablet Take 1 tablet (25 mg total) by mouth 3 (three) times daily as needed for dizziness.   methylPREDNISolone (MEDROL DOSEPAK) 4 MG TBPK tablet Take by mouth daily.   No facility-administered medications prior to visit.    ROS        Objective:     There were no vitals taken for this visit. {Vitals History (Optional):23777}  Physical Exam    No results found for any visits on 08/14/21. {Show previous labs (optional):23779}    Assessment & Plan:    Routine Health Maintenance and Physical Exam  Immunization History  Administered Date(s) Administered   Tdap 01/07/2006, 09/27/2016    Health Maintenance  Topic Date Due   COVID-19 Vaccine (1) Never done   HPV VACCINES (1 - Male 2-dose series) Never done   HIV Screening  Never done   Hepatitis C Screening  Never done   INFLUENZA VACCINE  08/07/2021   TETANUS/TDAP  09/28/2026    Discussed health benefits of physical activity, and encouraged him to engage in regular exercise appropriate for his age and condition.  Problem List Items Addressed This Visit   None  No follow-ups on file.     Renelda Loma, RMA

## 2021-08-16 ENCOUNTER — Ambulatory Visit: Payer: Self-pay | Admitting: Neurology

## 2021-08-16 ENCOUNTER — Encounter: Payer: Self-pay | Admitting: Neurology

## 2021-08-16 DIAGNOSIS — Z029 Encounter for administrative examinations, unspecified: Secondary | ICD-10-CM

## 2021-09-12 ENCOUNTER — Encounter: Payer: Self-pay | Admitting: Internal Medicine

## 2021-09-21 ENCOUNTER — Encounter: Payer: Self-pay | Admitting: Family Medicine

## 2021-09-21 DIAGNOSIS — Z0000 Encounter for general adult medical examination without abnormal findings: Secondary | ICD-10-CM

## 2021-09-25 ENCOUNTER — Encounter: Payer: Self-pay | Admitting: Family Medicine

## 2021-10-16 ENCOUNTER — Encounter: Payer: Self-pay | Admitting: Internal Medicine

## 2021-10-29 ENCOUNTER — Encounter: Payer: Self-pay | Admitting: Internal Medicine

## 2021-10-31 ENCOUNTER — Ambulatory Visit: Payer: Self-pay | Admitting: Neurology

## 2022-01-30 ENCOUNTER — Other Ambulatory Visit: Payer: Self-pay | Admitting: Neurology

## 2022-01-30 DIAGNOSIS — G40A09 Absence epileptic syndrome, not intractable, without status epilepticus: Secondary | ICD-10-CM

## 2022-02-05 ENCOUNTER — Other Ambulatory Visit (INDEPENDENT_AMBULATORY_CARE_PROVIDER_SITE_OTHER): Payer: Self-pay

## 2022-02-05 ENCOUNTER — Encounter: Payer: Self-pay | Admitting: Neurology

## 2022-02-05 ENCOUNTER — Telehealth: Payer: Self-pay

## 2022-02-05 ENCOUNTER — Ambulatory Visit (INDEPENDENT_AMBULATORY_CARE_PROVIDER_SITE_OTHER): Payer: Self-pay | Admitting: Neurology

## 2022-02-05 VITALS — BP 111/67 | HR 82 | Ht 69.0 in | Wt 131.0 lb

## 2022-02-05 DIAGNOSIS — G40A09 Absence epileptic syndrome, not intractable, without status epilepticus: Secondary | ICD-10-CM

## 2022-02-05 MED ORDER — LAMOTRIGINE 200 MG PO TABS: ORAL_TABLET | ORAL | 11 refills | Status: DC

## 2022-02-05 NOTE — Progress Notes (Signed)
NEUROLOGY FOLLOW UP OFFICE NOTE  NAQUAN GARMAN [id number] February 24, 1995  HISTORY OF PRESENT ILLNESS: I had the pleasure of seeing Kyrus Hyde in follow-up in the neurology clinic on 02/05/2022.  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 He is alone in the office today. Records and images were personally reviewed where available. On his last visit, he was reporting an increase in GTCs, having them on a near daily basis in early February in the setting of an ear infection. His 1-hour EEG in 03/2021 showed frequent generalized 3-4 Hz polyspike and wave discharges lasting 0.5-1.5 seconds. He is on Lamotrigine 200mg  2 tablets daily (400mg  daily) without side effects. He denies any further GTCs since 02/10/21. He has days where he feels like he will have a seizure, he feels like he had drank too much and things were spinning. He usually tries to relax and it eases off. He reports staring spells throughout the day, depending on what he is doing. He lives with his brother who has not mentioned any unresponsiveness in the past year. Mentally he feels like he is getting lost. They have previously reported getting lost in conversations, he continues to notice this. He was in a bad depression last week. He may be moving in with his father and his significant other in April, or he may move in with his mother in [place] in 4-5 months. He is currently unemployed but plans to start working again soon. He denies any headaches, dizziness, diplopia, no falls. He has cold sweats at night.    History on Initial Assessment 01/06/2014: This is a pleasant 27 yo RH man with a history of juvenile absence epilepsy. He started having staring spells at around age 27. His first generalized tonic-clonic seizure was at age 27. He denies any myoclonic jerks. His longest seizure-free interval is around 1 year. He had a generalized tonic-clonic seizure last 12/27/13 provoked by sleep deprivation with his newborn  child. Prior to this, his last seizure was in November 2014. He usually has a warning prior to the seizure, he would feel groggy, like his head is floating on a cloud, for several hours before the GTC. His father notes that his whole face sinks in, like has not slept in a while, he would be confused, asking the same question repeatedly, then would stare before going into a convulsion. If he is on the phone or Xbox, symptoms would get worse. He had been taking Lamotrigine 300mg /day with no side effects. He had been switched from immediate release to extended-release, with improvement in insomnia.   Prior AEDs: ethosuximide; dilantin; keppra  Epilepsy Risk Factors: His maternal aunt's daughter has seizures. Otherwise he had a normal birth and early development. No history of febrile convulsions, CNS infections, significant traumatic brain injury, neurosurgical procedures. He had a concussion with brief loss of consciousness after hitting his head on a soccer post.  Diagnostic Data: Prolonged nocturnal EEG at [facility] showed infrequent generalized polyspike and wave discharges while awake and runs a 2 to 3 Hz discharges lasting up to 2.5 seconds.  There is a solitary 6-second episode of 3.5 Hz spiking wave discharges over the left hemisphere best seen in the left temporal chain without clinical accompaniments.  During sleep his generalized spike wave activity increased in frequency whether it is less than one polyspike in wave discharge every 10 seconds to more than one per minute.  He again had clusters at 2 to 3 Hz lasting 2.5  seconds and an occasional multiple single discharges within a 10-second period.  This was an ambulatory EEG.  I personally reviewed head CT without contrast done 03/2011 which was normal.  Last Lamictal level 04/16/13: 7.2, 07/18/14 3.6.   PAST MEDICAL HISTORY: Past Medical History:  Diagnosis Date   Seizures ([place])    Syncope and collapse     MEDICATIONS: Current  Outpatient Medications on File Prior to Visit  Medication Sig Dispense Refill   acetaminophen (TYLENOL) 500 MG tablet Take 500 mg by mouth every 6 (six) hours as needed for mild pain or headache.     diphenhydrAMINE (BENADRYL) 25 MG tablet Take 1 tablet (25 mg total) by mouth every 6 (six) hours as needed. 30 tablet 0   ibuprofen (ADVIL,MOTRIN) 600 MG tablet Take 1 tablet (600 mg total) by mouth every 8 (eight) hours as needed. 30 tablet 0   lamoTRIgine (LAMICTAL) 200 MG tablet TAKE 2 TABLETS BY MOUTH IN THE MORNING 60 tablet 11   No current facility-administered medications on file prior to visit.    ALLERGIES: Allergies  Allergen Reactions   Dilantin [Phenytoin Sodium Extended] Other (See Comments)    Childhood allergy   Other Swelling    Blue cheese     FAMILY HISTORY: Family History  Problem Relation Age of Onset   Cancer Maternal Grandmother    Depression Maternal Grandmother    Early death Maternal Grandmother    Heart attack Maternal Grandmother        Died at the age of 28   Hypertension Paternal Grandmother    Early death Paternal Grandmother    Depression Paternal Grandfather    Cancer Maternal Aunt     SOCIAL HISTORY: Social History   Socioeconomic History   Marital status: Single    Spouse name: Not on file   Number of children: Not on file   Years of education: Not on file   Highest education level: Not on file  Occupational History   Not on file  Tobacco Use   Smoking status: Former    Types: Cigarettes    Quit date: 11/06/2021    Years since quitting: 0.2   Smokeless tobacco: Current   Tobacco comments:    Smokes 2 cigarettes daily.  Vaping Use   Vaping Use: Never used  Substance and Sexual Activity   Alcohol use: No    Alcohol/week: 0.0 standard drinks of alcohol   Drug use: Yes    Types: Marijuana    Comment: Smokes marijuana twice a week   Sexual activity: Yes    Partners: Female    Birth control/protection: Condom    Comment: ninth  grade; soccer; as of May 2012  Other Topics Concern   Not on file  Social History Narrative   Patient lives with dad and baby in a one story.   Not working at this time.      Education: high school.     Right handed   Social Determinants of Health   Financial Resource Strain: Not on file  Food Insecurity: Not on file  Transportation Needs: Not on file  Physical Activity: Not on file  Stress: Not on file  Social Connections: Not on file  Intimate Partner Violence: Not on file     PHYSICAL EXAM: Vitals:   02/05/22 1354  BP: 111/67  Pulse: 82  SpO2: 97%   General: No acute distress Head:  Normocephalic/atraumatic Skin/Extremities: No rash, no edema Neurological Exam: alert and awake. No aphasia or dysarthria.  Fund of knowledge is appropriate.  Attention and concentration are normal.   Cranial nerves: Pupils equal, round. Extraocular movements intact with no nystagmus. Visual fields full.  No facial asymmetry.  Motor: Bulk and tone normal, muscle strength 5/5 throughout with no pronator drift.   Finger to nose testing intact.  Gait narrow-based and steady, able to tandem walk adequately.  Romberg negative.   IMPRESSION: This is a 27 yo RH man with a history of juvenile absence epilepsy with absence seizures and generalized tonic-clonic seizures. He had been seizure-free for 3 years until he had a cluster of GTCs in 02/2021 in the setting of an ear infection. No GTCs since 02/10/21. EEG in 03/2021 showed frequent generalized poyspike and wave discharges. He continues to report staring episodes and losing train of thought. We discussed doing an ambulatory EEG to characterize his symptoms, however he is unable to do it at the moment. We discussed checking Lamictal level on 400mg  daily dose, and increasing to 400mg  in AM, 200mg  in PM. He is aware of [place] driving laws to stop driving after a seizure until 6 months seizure-free. Follow-up in 6 months, call for any changes.     Thank you for  allowing me to participate in his care.  Please do not hesitate to call for any questions or concerns.    Ellouise Newer, M.D.   CC: Dr. Redmond School

## 2022-02-05 NOTE — Telephone Encounter (Signed)
Pt needs an ASAP  PA for lamotrigine 200 mg TAKE 2 TABLETS BY MOUTH IN THE MORNING he only has a few days of medication left. Pt seen today in the office he brought a letter from his insurance with him that he needed the PA

## 2022-02-05 NOTE — Patient Instructions (Signed)
Good to see you.  Have bloodwork done for Lamictal level  2. Increase Lamotrigine 200mg : take 2 tablets in morning, 1 tablet at bedtime  3. Keep a calendar of the staring and episodes of losing train of thought  4. Follow-up in 6 months, call for any changes  Seizure Precautions: 1. If medication has been prescribed for you to prevent seizures, take it exactly as directed.  Do not stop taking the medicine without talking to your doctor first, even if you have not had a seizure in a long time.   2. Avoid activities in which a seizure would cause danger to yourself or to others.  Don't operate dangerous machinery, swim alone, or climb in high or dangerous places, such as on ladders, roofs, or girders.  Do not drive unless your doctor says you may.  3. If you have any warning that you may have a seizure, lay down in a safe place where you can't hurt yourself.    4.  No driving for 6 months from last seizure, as per [facility].   Please refer to the following link on the [facility] website for more information: http://www.epilepsyfoundation.org/answerplace/Social/driving/drivingu.cfm   5.  Maintain good sleep hygiene. Avoid alcohol.  6.  Contact your doctor if you have any problems that may be related to the medicine you are taking.  7.  Call 911 and bring the patient back to the ED if:        A.  The seizure lasts longer than 5 minutes.       B.  The patient doesn't awaken shortly after the seizure  C.  The patient has new problems such as difficulty seeing, speaking or moving  D.  The patient was injured during the seizure  E.  The patient has a temperature over 102 F (39C)  F.  The patient vomited and now is having trouble breathing

## 2022-02-06 ENCOUNTER — Other Ambulatory Visit (HOSPITAL_COMMUNITY): Payer: Self-pay

## 2022-02-06 NOTE — Telephone Encounter (Signed)
Pt called an informed that no PA is needed and that he should be able to go get his medication with no problem

## 2022-02-06 NOTE — Telephone Encounter (Signed)
Per test claim lamotrigine 200mg  90 per 30 days is covered and does NOT need a PA.

## 2022-02-07 LAB — LAMOTRIGINE LEVEL: Lamotrigine Lvl: 12 ug/mL (ref 2.5–15.0)

## 2022-02-13 ENCOUNTER — Telehealth: Payer: Self-pay

## 2022-02-13 NOTE — Telephone Encounter (Signed)
Pt called informed that Lamictal level is within range. He should let us know if the increase in dose is causing him side effects (dizziness, double vision, unsteadiness). He stated that he is Dizzy for about 30 mins after taken it then he is ok.

## 2022-02-13 NOTE — Telephone Encounter (Signed)
-----   Message from Cameron Sprang, MD sent at 02/11/2022 12:55 PM EST ----- Pls let him know that the Lamictal level is within range. He should let us know if the increase in dose is causing him side effects (dizziness, double vision, unsteadiness). Thanks

## 2022-02-19 ENCOUNTER — Telehealth: Payer: Self-pay | Admitting: Neurology

## 2022-02-19 NOTE — Telephone Encounter (Signed)
Pt called in stating he had a seizure last night around 6:00 PM and his little brother said it seemed different this time. The right side of his head was laying on his shoulder and his convulsing was heavier than usual.

## 2022-02-19 NOTE — Telephone Encounter (Addendum)
Pt called no answer left a voice mail to call the office back   Pt c/o: seizure Missed medications?  No  Sleep deprived?  No Alcohol intake?  No   Increased stress? Yes he has a little increase in stress Any change in medication color or shape? No Any triggers? Wakes up feeling hung over but not drunk then he has staring episodes until he had seizure,  Back to their usual baseline self? Yes   If no, advise go to ER just has a headache  Hit his head on something wood Current medications prescribed by Dr. Delice Lesch: lamotrigine 200 mg  Take 2 tablets every morning and 1 tablet every night

## 2022-02-20 NOTE — Telephone Encounter (Signed)
Pt called an informed that We just recently increased his dose of medication. Does he want to stay on same dose for now or add on a second seizure medication?  he stated that if you really want him to start a 2nd one he will but he would like NOT to  because he already has to pay out a lot every month for medication ,

## 2022-02-20 NOTE — Telephone Encounter (Signed)
We just recently increased his dose of medication. Does he want to stay on same dose for now or add on a second seizure medication?

## 2022-02-21 NOTE — Telephone Encounter (Signed)
Since med was recently increased, would monitor for now and stay on same dose. Call for any changes. No driving until 6 months seizure-free. Thanks

## 2022-02-21 NOTE — Telephone Encounter (Signed)
Pt called informed that med was recently increased, would monitor for now and stay on same dose. Call for any changes. No driving until 6 months seizure-free. Pt verbalized understanding

## 2022-03-11 ENCOUNTER — Telehealth: Payer: Self-pay

## 2022-03-11 DIAGNOSIS — M542 Cervicalgia: Secondary | ICD-10-CM

## 2022-03-11 NOTE — Telephone Encounter (Signed)
  Pt called an informed that it May be a pinched nerve from pulling muscles during the seizure. We can do a cervical spine xray, he can try taking Naproxen '500mg'$  twice a day for 5 days to bring down inflammation

## 2022-03-11 NOTE — Telephone Encounter (Signed)
Patient called in stating that he had a seizure a few weeks ago and is still having the after effects and is waiting to hear back form [place] about how to deal with the after effects.

## 2022-03-11 NOTE — Telephone Encounter (Signed)
Pt stated that he feels like he has a pain in his neck feels ike its on fire then it goes away. Feels like  ball sitting where his brain and spine meet

## 2022-03-11 NOTE — Telephone Encounter (Signed)
May be a pinched nerve from pulling muscles during the seizure. We can do a cervical spine xray, he can try taking Naproxen '500mg'$  twice a day for 5 days to bring down inflammation. Thanks

## 2022-05-02 ENCOUNTER — Other Ambulatory Visit: Payer: Self-pay

## 2022-05-02 ENCOUNTER — Encounter (HOSPITAL_BASED_OUTPATIENT_CLINIC_OR_DEPARTMENT_OTHER): Payer: Self-pay | Admitting: Pediatrics

## 2022-05-02 ENCOUNTER — Emergency Department (HOSPITAL_BASED_OUTPATIENT_CLINIC_OR_DEPARTMENT_OTHER)
Admission: EM | Admit: 2022-05-02 | Discharge: 2022-05-02 | Disposition: A | Discharge status: Home / Self Care | Payer: Self-pay | Attending: Emergency Medicine | Admitting: Emergency Medicine

## 2022-05-02 ENCOUNTER — Emergency Department (HOSPITAL_BASED_OUTPATIENT_CLINIC_OR_DEPARTMENT_OTHER): Payer: Self-pay

## 2022-05-02 DIAGNOSIS — X58XXXA Exposure to other specified factors, initial encounter: Secondary | ICD-10-CM | POA: Insufficient documentation

## 2022-05-02 DIAGNOSIS — M25511 Pain in right shoulder: Secondary | ICD-10-CM

## 2022-05-02 DIAGNOSIS — Y9389 Activity, other specified: Secondary | ICD-10-CM | POA: Insufficient documentation

## 2022-05-02 DIAGNOSIS — S4991XA Unspecified injury of right shoulder and upper arm, initial encounter: Secondary | ICD-10-CM | POA: Insufficient documentation

## 2022-05-02 MED ORDER — IBUPROFEN 800 MG PO TABS
800.0000 mg | ORAL_TABLET | Freq: Once | ORAL | Status: AC
Start: 2022-05-02 — End: 2022-05-02
  Administered 2022-05-02: 800 mg via ORAL
  Filled 2022-05-02: qty 1

## 2022-05-02 NOTE — ED Provider Notes (Signed)
EMERGENCY DEPARTMENT AT [facility] Provider Note   CSN: [id number] Arrival date & time: 05/02/22  1329     History  Chief Complaint  Patient presents with   Shoulder Injury    Jesse Henry is a 27 y.o. male.  Jesse Henry is a 27 y.o. male who is otherwise healthy, presents to the ED for evaluation of right shoulder pain.  Patient reports yesterday while playing with his dog and trying to pull it toy from the dog's mouth his right shoulder dislocated.  He was able to put his shoulder back in but reports that he is having significant pain with movement of the shoulder.  Created a makeshift sling with household items prior to arrival.  Reports he has had numerous prior shoulder dislocations ever since an initial shoulder injury at age 21.  He has never had any surgery on the shoulder.  He denies any numbness weakness or tingling in the right arm.  No other aggravating or alleviating factors.  The history is provided by the patient.  Shoulder Injury       Home Medications Prior to Admission medications   Medication Sig Start Date End Date Taking? Authorizing Provider  acetaminophen (TYLENOL) 500 MG tablet Take 500 mg by mouth every 6 (six) hours as needed for mild pain or headache.    [provider]  diphenhydrAMINE (BENADRYL) 25 MG tablet Take 1 tablet (25 mg total) by mouth every 6 (six) hours as needed. 02/11/21   Cristopher Peru, PA-C  ibuprofen (ADVIL,MOTRIN) 600 MG tablet Take 1 tablet (600 mg total) by mouth every 8 (eight) hours as needed. 07/27/14   Tysinger, Kermit Balo, PA-C  lamoTRIgine (LAMICTAL) 200 MG tablet Take 2 tablets every morning and 1 tablet every night 02/05/22   Van Clines, MD      Allergies    Dilantin [phenytoin sodium extended] and Other    Review of Systems   Review of Systems  Musculoskeletal:  Positive for arthralgias.    Physical Exam Updated Vital Signs BP (!) 116/92 (BP Location: Left Arm)   Pulse 85    Temp 98.4 F (36.9 C) (Oral)   Resp 18   Ht 5\' 9"  (1.753 m)   Wt 58.1 kg   SpO2 100%   BMI 18.90 kg/m  Physical Exam Vitals and nursing note reviewed.  Constitutional:      General: He is not in acute distress.    Appearance: Normal appearance. He is well-developed. He is not ill-appearing or diaphoretic.  HENT:     Head: Normocephalic and atraumatic.  Eyes:     General:        Right eye: No discharge.        Left eye: No discharge.  Pulmonary:     Effort: Pulmonary effort is normal. No respiratory distress.  Musculoskeletal:     Comments: Right shoulder with significantly limited active range of motion due to pain, no obvious palpable deformity, passive range of motion intact.  No ecchymosis or skin changes, no tenderness or pain at the elbow or wrist with range of motion intact, distal pulses 2+, 5/5 grip strength and normal sensation  Skin:    General: Skin is warm and dry.  Neurological:     Mental Status: He is alert and oriented to person, place, and time.     Coordination: Coordination normal.  Psychiatric:        Mood and Affect: Mood normal.  Behavior: Behavior normal.     ED Results / Procedures / Treatments   Labs (all labs ordered are listed, but only abnormal results are displayed) Labs Reviewed - No data to display  EKG None  Radiology DG Shoulder Right  Result Date: 05/02/2022 CLINICAL DATA:  Was pulling an object from his dog and felt shoulder dislocate, patient relocated shoulder himself but shoulder still hurts EXAM: RIGHT SHOULDER - 2+ VIEW COMPARISON:  None Available. FINDINGS: Osseous mineralization normal. AC joint alignment normal. No glenohumeral fracture, dislocation, or bone destruction. Visualized ribs unremarkable. IMPRESSION: Normal exam. Electronically Signed   By: Ulyses Southward M.D.   On: 05/02/2022 14:21    Procedures Procedures    Medications Ordered in ED Medications - No data to display  ED Course/ Medical Decision Making/  A&P                             Medical Decision Making Amount and/or Complexity of Data Reviewed Radiology: ordered.  Risk Prescription drug management.   27 year old male with a history of recurrent shoulder dislocations presents to the ED with persistent right shoulder pain after right shoulder dislocated last night was able to put the shoulder back in.  He has reduced active range of motion but the right upper extremity is neurovascularly intact.  X-ray of the right shoulder obtained, viewed and interpreted independently with no evidence of dislocation or fracture.  Suspect soft tissue derangement of the shoulder, given numerous prior dislocations patient is at high risk for labral tear.  Patient placed in shoulder sling, encouraged to use ibuprofen, Tylenol and ice.  Given referral for orthopedic follow-up, will likely MRI for further evaluation of shoulder.  At this time there does not appear to be any evidence of an acute emergency medical condition requiring further emergent evaluation and the patient appears stable for discharge with appropriate outpatient follow up. Diagnosis and return precautions discussed with patient who verbalizes understanding and is agreeable to discharge.           Final Clinical Impression(s) / ED Diagnoses Final diagnoses:  Acute pain of right shoulder    Rx / DC Orders ED Discharge Orders     None         Legrand Rams 05/12/22 1532    Maia Plan, MD 05/14/22 1345

## 2022-05-02 NOTE — ED Triage Notes (Signed)
Reported dislocated right shoulder yesterday while playing with his dog; stated hx of dislocation on the same side. C/O potential allergic reaction on right side of eye

## 2022-05-02 NOTE — Discharge Instructions (Addendum)
With her history of recurrent shoulder dislocations I have high clinical concern for a labral tear.  You will need to follow-up with orthopedics for further intervention.  Please remain in sling until you are seen by orthopedics.  You can use ibuprofen 800 mg every 8 hours and Tylenol 1000 mg every 8 hours as needed for pain.  You can also apply ice to help with pain and swelling.

## 2022-05-07 ENCOUNTER — Encounter: Payer: Self-pay | Admitting: Physician Assistant

## 2022-05-07 ENCOUNTER — Ambulatory Visit (INDEPENDENT_AMBULATORY_CARE_PROVIDER_SITE_OTHER): Payer: Self-pay | Admitting: Orthopaedic Surgery

## 2022-05-07 DIAGNOSIS — M25511 Pain in right shoulder: Secondary | ICD-10-CM

## 2022-05-07 DIAGNOSIS — G8929 Other chronic pain: Secondary | ICD-10-CM

## 2022-05-07 MED ORDER — TRAMADOL HCL 50 MG PO TABS: 50.0000 mg | ORAL_TABLET | Freq: Two times a day (BID) | ORAL | 1 refills | Status: DC | PRN

## 2022-05-07 NOTE — Progress Notes (Signed)
Office Visit Note   Patient: Jesse Henry           Date of Birth: 02/22/1995           MRN: [id number] Visit Date: 05/07/2022              Requested by: Ronnald Nian, MD [address] [place],  [place] [address] PCP: Ronnald Nian, MD   Assessment & Plan: Visit Diagnoses:  1. Chronic right shoulder pain     Plan: Impression is multiple dislocations to the right shoulder.  At this point, we will have him continue wearing his sling.  Will order an MR arthrogram to assess for structural abnormalities.  He will follow-up with [place] once this is been completed.  I will send in tramadol to take for pain.  Call with concerns or questions.  Follow-Up Instructions: Return in about 2 weeks (around 05/21/2022).   Orders:  No orders of the defined types were placed in this encounter.  Meds ordered this encounter  Medications   traMADol (ULTRAM) 50 MG tablet    Sig: Take 1 tablet (50 mg total) by mouth every 12 (twelve) hours as needed.    Dispense:  30 tablet    Refill:  1      Procedures: No procedures performed   Clinical Data: No additional findings.   Subjective: Chief Complaint  Patient presents with   Right Shoulder - Pain    HPI patient is a pleasant 27 year old gentleman who comes in today for ED follow-up.  He was playing with his dog on 05/02/2022 when he tried to grab a toy out of his mouth dislocating his right shoulder.  He notes he was able to reduce this himself.  He was seen in the ED where x-rays were obtained.  These were negative for dislocation.  He was placed in a sling.  He is here today for follow-up.  He tells me his first dislocation was when he was 27 years old and playing soccer.  He was knocked up against the goalpost and then fell to the ground dislocating his shoulder.  He notes he has had multiple dislocations since.  He has never been to physical therapy and has never had surgery for the shoulder.  He denies any numbness, tingling or  burning.  He is taking ibuprofen for pain which does not help.  Review of Systems as detailed in HPI.  All others reviewed and are negative.   Objective: Vital Signs: There were no vitals taken for this visit.  Physical Exam well-developed well-nourished gentleman in no acute distress.  Alert and oriented x 3.  Ortho Exam right shoulder exam very limited range of motion secondary to pain.  He is able to abduct the shoulder.  He has full sensation to the deltoid.  He is neurovascularly intact distally.  Specialty Comments:  No specialty comments available.  Imaging: No new imaging   PMFS History: Patient Active Problem List   Diagnosis Date Noted   Allergic rhinitis, mild 04/12/2021   Juvenile absence epilepsy (HCC) 07/07/2014   Insomnia 10/26/2013   Long-term use of high-risk medication 05/05/2012   Postconcussion syndrome 05/05/2012   Past Medical History:  Diagnosis Date   Seizures (HCC)    Syncope and collapse     Family History  Problem Relation Age of Onset   Cancer Maternal Grandmother    Depression Maternal Grandmother    Early death Maternal Grandmother    Heart attack Maternal Grandmother  Died at the age of 29   Hypertension Paternal Grandmother    Early death Paternal Grandmother    Depression Paternal Grandfather    Cancer Maternal Aunt     Past Surgical History:  Procedure Laterality Date   HAND SURGERY     Social History   Occupational History   Not on file  Tobacco Use   Smoking status: Former    Types: Cigarettes    Quit date: 11/06/2021    Years since quitting: 0.4   Smokeless tobacco: Current   Tobacco comments:    Smokes 2 cigarettes daily.  Vaping Use   Vaping Use: Never used  Substance and Sexual Activity   Alcohol use: No    Alcohol/week: 0.0 standard drinks of alcohol   Drug use: Yes    Types: Marijuana    Comment: Smokes marijuana twice a week   Sexual activity: Yes    Partners: Female    Birth control/protection:  Condom    Comment: ninth grade; soccer; as of May 2012

## 2022-09-04 ENCOUNTER — Ambulatory Visit: Payer: Self-pay | Admitting: Neurology

## 2022-09-04 ENCOUNTER — Encounter: Payer: Self-pay | Admitting: Neurology

## 2022-09-04 DIAGNOSIS — Z029 Encounter for administrative examinations, unspecified: Secondary | ICD-10-CM

## 2023-02-04 ENCOUNTER — Telehealth (HOSPITAL_COMMUNITY): Payer: Self-pay | Admitting: Licensed Clinical Social Worker

## 2023-02-04 NOTE — Telephone Encounter (Addendum)
The therapist receives a ROI from [facility] concerning this patient and sends the following, secure email:  "To date, we have had no contact with this client, nor does he have any behavioral health appointments scheduled in our system."  [facility] responds as follows:  "PO and Client are discussing ways in which he can receive mental health treatment outside of [facility] due to being unable to return due to owing a balance. Currently struggling with mental health and was encouraged last appointment to engage with [facility] for MH evaluation. Next [facility] 02/17/23 at 9am."  [place], MA, LCSW, [facility], LCAS 02/04/2023

## 2023-02-25 ENCOUNTER — Other Ambulatory Visit: Payer: Self-pay | Admitting: Neurology

## 2023-02-25 DIAGNOSIS — G40A09 Absence epileptic syndrome, not intractable, without status epilepticus: Secondary | ICD-10-CM

## 2023-03-04 ENCOUNTER — Encounter: Payer: Self-pay | Admitting: Internal Medicine

## 2023-03-04 ENCOUNTER — Telehealth (HOSPITAL_COMMUNITY): Payer: Self-pay | Admitting: Licensed Clinical Social Worker

## 2023-03-04 NOTE — Telephone Encounter (Signed)
 The therapist receives a request for an update on this patient from Ms. Cosette C. Nerogic, B.S., CADC-I, [facility] Care Manager and sends the following, secure email:  "We have still had no contact with Mr. Cullers, nor does he currently have any appointments scheduled with [place]."   Myrna Blazer, MA, LCSW, [facility], LCAS 03/04/2023

## 2023-03-26 ENCOUNTER — Ambulatory Visit: Payer: Self-pay | Admitting: Family Medicine

## 2023-03-26 NOTE — Telephone Encounter (Signed)
  Chief Complaint: medication need- patient states he has been released from neurology and needs to establish care- but he is going to run out of his anti seizure medication on Saturday Symptoms: no symptoms at this time- but does not want to run out of medication  Frequency: last seizure 2 weeks ago Pertinent Negatives: Patient denies symptoms at this time Disposition: [] ED /[] Urgent Care (no appt availability in office) / [x] Appointment(In office/virtual)/ []  [facility]/ [] Home Care/ [] Refused Recommended Disposition /[] [facility]/ []  Follow-up with PCP Additional Notes: Appointment scheduled with provider to discuss referral if needed continuation of medication.    Copied from CRM [id number]. Topic: Clinical - Red Word Triage >> Mar 26, 2023 10:58 AM Shon Hale wrote: Red Word that prompted transfer to Nurse Triage: Patient had seizure about 2 weeks ago. Reason for Disposition  Stopped taking seizure medicine (anticonvulsant)  Answer Assessment - Initial Assessment Questions 1. ONSET: "When did the seizure occur?"     2 weeks ago 2. DURATION: "How long did the seizure last (or how long has it been happening)?" (e.g., seconds, minutes)  Note: Most seizures last less than 5 minutes.     Not sure- 30-45 seconds 3. DESCRIPTION: "Describe what happened during the seizure." "Did the body become stiff?" "Was there any jerking?"  "Did they lose consciousness during the seizure?"     Grand mal- progressive- patient loses consciousness every time 4. CIRCUMSTANCE: "What was the person doing when the seizure began?"      Patient woke feeling like "seizure" patient knows in advance- just not when- 90% of time at night 5. MENTAL STATUS AFTER SEIZURE: "Does the person seem more groggy or sleepy?" "Does the person know who they are, who you are, and where they are now?"      Groggy- hung over,drunk, a little confused- but does know his name 6. PRIOR SEIZURES: "Has the person had a  seizure (convulsion) before?" (e.g., epilepsy, other cause)  If Yes, ask: "When was the last time?" and "What happened last time?"      Yes- epilepsy 7. EPILEPSY: "Does the person have epilepsy?" Note: Check for medical ID bracelet.     yes 8. MEDICINES: "Does the person take anticonvulsant medications?" (e.g., Yes, No; missed doses, any recent changes)     Patient is taking medication- but needs refill will be out Saturday  Protocols used: [facility]

## 2023-03-27 ENCOUNTER — Encounter: Payer: Self-pay | Admitting: Family Medicine

## 2023-03-27 ENCOUNTER — Ambulatory Visit (INDEPENDENT_AMBULATORY_CARE_PROVIDER_SITE_OTHER): Payer: Self-pay | Admitting: Family Medicine

## 2023-03-27 VITALS — BP 112/70 | HR 91 | Wt 135.2 lb

## 2023-03-27 DIAGNOSIS — G40A09 Absence epileptic syndrome, not intractable, without status epilepticus: Secondary | ICD-10-CM

## 2023-03-27 MED ORDER — LAMOTRIGINE 200 MG PO TABS: ORAL_TABLET | ORAL | 3 refills | Status: AC

## 2023-03-27 NOTE — Progress Notes (Signed)
   Subjective:    Patient ID: Jesse Henry, male    DOB: June 20, 1995, 28 y.o.   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  He was seen by neurology and placed on Lamictal.  He apparently missed an appointment or 2 and with dropped from the neurology office.  That note was reevaluated.  He did state that he had another seizure approximately 2 weeks ago.  He is aware of the fact that if this occurs then driving is held for 6 months.   Review of Systems     Objective:    Physical Exam Alert and in no distress otherwise not examined       Assessment & Plan:  Juvenile absence epilepsy (HCC) - Plan: Lamotrigine level, lamoTRIgine (LAMICTAL) 200 MG tablet A Lamictal level was ordered.  I will go ahead and increase his dosing as per the recommendation of neurology to 400 mg dosing in the morning and 200 at night.  Plan to see him in about 6 months or sooner if needed.  He was comfortable with that.

## 2023-03-28 LAB — LAMOTRIGINE LEVEL: Lamotrigine Lvl: 10.3 ug/mL (ref 2.0–20.0)

## 2023-03-29 ENCOUNTER — Encounter: Payer: Self-pay | Admitting: Family Medicine

## 2023-05-01 ENCOUNTER — Emergency Department (HOSPITAL_COMMUNITY): Payer: Self-pay

## 2023-05-01 ENCOUNTER — Other Ambulatory Visit: Payer: Self-pay

## 2023-05-01 ENCOUNTER — Emergency Department (HOSPITAL_COMMUNITY)
Admission: EM | Admit: 2023-05-01 | Discharge: 2023-05-01 | Disposition: A | Discharge status: Home / Self Care | Payer: Self-pay | Attending: Emergency Medicine | Admitting: Emergency Medicine

## 2023-05-01 ENCOUNTER — Encounter (HOSPITAL_COMMUNITY): Payer: Self-pay

## 2023-05-01 DIAGNOSIS — S42002A Fracture of unspecified part of left clavicle, initial encounter for closed fracture: Secondary | ICD-10-CM

## 2023-05-01 DIAGNOSIS — S42022A Displaced fracture of shaft of left clavicle, initial encounter for closed fracture: Secondary | ICD-10-CM | POA: Insufficient documentation

## 2023-05-01 DIAGNOSIS — Z23 Encounter for immunization: Secondary | ICD-10-CM | POA: Insufficient documentation

## 2023-05-01 DIAGNOSIS — S31119A Laceration without foreign body of abdominal wall, unspecified quadrant without penetration into peritoneal cavity, initial encounter: Secondary | ICD-10-CM | POA: Insufficient documentation

## 2023-05-01 DIAGNOSIS — R944 Abnormal results of kidney function studies: Secondary | ICD-10-CM | POA: Insufficient documentation

## 2023-05-01 DIAGNOSIS — S30810A Abrasion of lower back and pelvis, initial encounter: Secondary | ICD-10-CM | POA: Insufficient documentation

## 2023-05-01 LAB — RAPID URINE DRUG SCREEN, HOSP PERFORMED
Amphetamines: NOT DETECTED
Barbiturates: NOT DETECTED
Benzodiazepines: NOT DETECTED
Cocaine: NOT DETECTED
Opiates: NOT DETECTED
Tetrahydrocannabinol: POSITIVE — AB

## 2023-05-01 LAB — URINALYSIS, ROUTINE W REFLEX MICROSCOPIC
Bilirubin Urine: NEGATIVE
Glucose, UA: NEGATIVE mg/dL
Hgb urine dipstick: NEGATIVE
Ketones, ur: NEGATIVE mg/dL
Leukocytes,Ua: NEGATIVE
Nitrite: NEGATIVE
Protein, ur: NEGATIVE mg/dL
Specific Gravity, Urine: 1.018 (ref 1.005–1.030)
pH: 7 (ref 5.0–8.0)

## 2023-05-01 LAB — ETHANOL: Alcohol, Ethyl (B): <15 mg/dL (ref <15)

## 2023-05-01 LAB — I-STAT CHEM 8, ED
BUN: 18 mg/dL (ref 6–20)
Calcium, Ion: 1.11 mmol/L — ABNORMAL LOW (ref 1.15–1.40)
Chloride: 103 mmol/L (ref 98–111)
Creatinine, Ser: 1.3 mg/dL — ABNORMAL HIGH (ref 0.61–1.24)
Glucose, Bld: 80 mg/dL (ref 70–99)
HCT: 48 % (ref 39.0–52.0)
Hemoglobin: 16.3 g/dL (ref 13.0–17.0)
Potassium: 3.9 mmol/L (ref 3.5–5.1)
Sodium: 141 mmol/L (ref 135–145)
TCO2: 25 mmol/L (ref 22–32)

## 2023-05-01 MED ORDER — FENTANYL CITRATE PF 50 MCG/ML IJ SOSY
100.0000 ug | PREFILLED_SYRINGE | Freq: Once | INTRAMUSCULAR | Status: AC
  Administered 2023-05-01: 100 ug via INTRAVENOUS
  Filled 2023-05-01: qty 2

## 2023-05-01 MED ORDER — OXYCODONE HCL 5 MG PO TABS
15.0000 mg | ORAL_TABLET | Freq: Once | ORAL | Status: AC
  Administered 2023-05-01: 15 mg via ORAL
  Filled 2023-05-01: qty 3

## 2023-05-01 MED ORDER — IBUPROFEN 600 MG PO TABS: 600.0000 mg | ORAL_TABLET | Freq: Four times a day (QID) | ORAL | 0 refills | Status: AC | PRN

## 2023-05-01 MED ORDER — IOHEXOL 350 MG/ML SOLN
80.0000 mL | Freq: Once | INTRAVENOUS | Status: AC | PRN
  Administered 2023-05-01: 80 mL via INTRAVENOUS

## 2023-05-01 MED ORDER — OXYCODONE-ACETAMINOPHEN 5-325 MG PO TABS: 1.0000 | ORAL_TABLET | Freq: Four times a day (QID) | ORAL | 0 refills | Status: DC | PRN

## 2023-05-01 MED ORDER — OXYCODONE-ACETAMINOPHEN 5-325 MG PO TABS: 2.0000 | ORAL_TABLET | Freq: Once | ORAL | Status: DC

## 2023-05-01 MED ORDER — TETANUS-DIPHTH-ACELL PERTUSSIS 5-2.5-18.5 LF-MCG/0.5 IM SUSY
0.5000 mL | PREFILLED_SYRINGE | Freq: Once | INTRAMUSCULAR | Status: AC
  Administered 2023-05-01: 0.5 mL via INTRAMUSCULAR
  Filled 2023-05-01: qty 0.5

## 2023-05-01 MED ORDER — OXYCODONE-ACETAMINOPHEN 5-325 MG PO TABS: 1.0000 | ORAL_TABLET | Freq: Four times a day (QID) | ORAL | 0 refills | Status: AC | PRN
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
## 2023-05-01 NOTE — Discharge Instructions (Signed)
 Keep shoulder immobilizer in place Please use cold therapy Use ibuprofen  for pain.  You may use Percocet 1 every 6 hours as needed for breakthrough pain Call Dr. Verline Glow office for follow-up to schedule surgery
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
## 2023-05-01 NOTE — ED Notes (Signed)
 Patient transported to CT
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
## 2023-05-05 ENCOUNTER — Ambulatory Visit (HOSPITAL_BASED_OUTPATIENT_CLINIC_OR_DEPARTMENT_OTHER): Payer: Self-pay | Admitting: Orthopaedic Surgery

## 2023-05-05 ENCOUNTER — Encounter (HOSPITAL_COMMUNITY): Payer: Self-pay | Admitting: Orthopaedic Surgery

## 2023-05-05 ENCOUNTER — Other Ambulatory Visit: Payer: Self-pay

## 2023-05-05 ENCOUNTER — Other Ambulatory Visit (HOSPITAL_BASED_OUTPATIENT_CLINIC_OR_DEPARTMENT_OTHER): Payer: Self-pay

## 2023-05-05 DIAGNOSIS — S42022A Displaced fracture of shaft of left clavicle, initial encounter for closed fracture: Secondary | ICD-10-CM

## 2023-05-05 MED ORDER — OXYCODONE HCL 5 MG PO TABS
5.0000 mg | ORAL_TABLET | ORAL | 0 refills | Status: AC | PRN
  Filled 2023-05-05: qty 15, 3d supply, fill #0

## 2023-05-05 MED ORDER — ACETAMINOPHEN 500 MG PO TABS
500.0000 mg | ORAL_TABLET | Freq: Three times a day (TID) | ORAL | 0 refills | Status: AC
  Filled 2023-05-05: qty 30, 10d supply, fill #0

## 2023-05-05 MED ORDER — ASPIRIN 325 MG PO TBEC
325.0000 mg | DELAYED_RELEASE_TABLET | Freq: Every day | ORAL | 0 refills | Status: AC
  Filled 2023-05-05: qty 14, 14d supply, fill #0

## 2023-05-05 MED ORDER — IBUPROFEN 800 MG PO TABS
800.0000 mg | ORAL_TABLET | Freq: Three times a day (TID) | ORAL | 0 refills | Status: AC
  Filled 2023-05-05: qty 30, 10d supply, fill #0

## 2023-05-05 NOTE — Pre-Procedure Instructions (Signed)
-------------    SDW INSTRUCTIONS given:  Your procedure is scheduled on 4/29.  Report to [facility] Main Entrance "A" at 11:00 A.M., and check in at the Admitting office.  Any questions or running late day of surgery: call [phone]    Remember:  Do not eat after midnight the night before your surgery  You may drink clear liquids until 10:30 AM the morning of your surgery.   Clear liquids allowed are: Water, Non-Citrus Juices (without pulp), Carbonated Beverages, Clear Tea, Black Coffee Only, and Gatorade    Take these medicines the morning of surgery with A SIP OF WATER  lamictal         May take these medicines IF NEEDED: tylenol , roxicodone , percocet   As of today, STOP taking any Aspirin (unless otherwise instructed by your surgeon) Aleve , Naproxen , Ibuprofen , Motrin , Advil , Goody's, BC's, all herbal medications, fish oil, and all vitamins.   Do NOT Smoke (Tobacco/Vaping) 24 hours prior to your procedure  If you use a CPAP at night, you may bring all equipment for your overnight stay.     You will be asked to remove any contacts, glasses, piercing's, hearing aid's, dentures/partials prior to surgery. Please bring cases for these items if needed.     Patients discharged the day of surgery will not be allowed to drive home, and someone needs to stay with them for 24 hours.  SURGICAL WAITING ROOM VISITATION Patients may have no more than 2 support people in the waiting area - these visitors may rotate.   Pre-op nurse will coordinate an appropriate time for 1 ADULT support person, who may not rotate, to accompany patient in pre-op.  Children under the age of 65 must have an adult with them who is not the patient and must remain in the main waiting area with an adult.  If the patient needs to stay at the hospital during part of their recovery, the visitor guidelines for inpatient rooms apply.  Please refer to the [facility] website for the visitor guidelines for any  additional information.   Special instructions:   [place]- Preparing For Surgery   Please follow these instructions carefully.   Shower the NIGHT BEFORE SURGERY and the MORNING OF SURGERY with DIAL Soap.   Pat yourself dry with a CLEAN TOWEL.  Wear CLEAN PAJAMAS to bed the night before surgery  Place CLEAN SHEETS on your bed the night of your first shower and DO NOT SLEEP WITH PETS.   Additional instructions for the day of surgery: DO NOT APPLY any lotions, deodorants, cologne, or perfumes.   Do not wear jewelry or makeup Do not wear nail polish, gel polish, artificial nails, or any other type of covering on natural nails (fingers and toes) Do not bring valuables to the hospital. [facility] is not responsible for valuables/personal belongings. Put on clean/comfortable clothes.  Please brush your teeth.  Ask your nurse before applying any prescription medications to the skin.
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
## 2023-05-05 NOTE — Progress Notes (Signed)
 PCP - Dr. Ron Cobbs Cardiologist - denies  PPM/ICD - denies   Chest x-ray - 05/01/23 EKG - n/a Stress Test - denies ECHO - denies Cardiac Cath - denies  CPAP - denies  DM- denies  Blood Thinner Instructions: n/a Aspirin Instructions: pt not taking until after surgery  ERAS Protcol - clears until 1030  COVID TEST- n/a  Anesthesia review: no  Patient verbally denies any shortness of breath, fever, cough and chest pain during phone call      Questions were answered. Patient verbalized understanding of instructions.

## 2023-05-06 ENCOUNTER — Other Ambulatory Visit (HOSPITAL_BASED_OUTPATIENT_CLINIC_OR_DEPARTMENT_OTHER): Payer: Self-pay

## 2023-05-06 ENCOUNTER — Ambulatory Visit (HOSPITAL_COMMUNITY)
Admission: RE | Admit: 2023-05-06 | Discharge: 2023-05-06 | Disposition: A | Discharge status: Home / Self Care | Payer: Self-pay | Attending: Orthopaedic Surgery | Admitting: Orthopaedic Surgery

## 2023-05-06 ENCOUNTER — Other Ambulatory Visit: Payer: Self-pay

## 2023-05-06 ENCOUNTER — Encounter (HOSPITAL_COMMUNITY): Payer: Self-pay | Admitting: Orthopaedic Surgery

## 2023-05-06 ENCOUNTER — Ambulatory Visit (HOSPITAL_COMMUNITY): Payer: Self-pay

## 2023-05-06 ENCOUNTER — Ambulatory Visit (HOSPITAL_COMMUNITY): Payer: Self-pay | Admitting: Registered Nurse

## 2023-05-06 ENCOUNTER — Ambulatory Visit (HOSPITAL_BASED_OUTPATIENT_CLINIC_OR_DEPARTMENT_OTHER): Payer: Self-pay | Admitting: Registered Nurse

## 2023-05-06 ENCOUNTER — Encounter (HOSPITAL_COMMUNITY)
Admission: RE | Disposition: A | Discharge status: Home / Self Care | Payer: Self-pay | Source: Home / Self Care | Attending: Orthopaedic Surgery

## 2023-05-06 DIAGNOSIS — F1290 Cannabis use, unspecified, uncomplicated: Secondary | ICD-10-CM | POA: Insufficient documentation

## 2023-05-06 DIAGNOSIS — Z87891 Personal history of nicotine dependence: Secondary | ICD-10-CM | POA: Insufficient documentation

## 2023-05-06 DIAGNOSIS — S42022A Displaced fracture of shaft of left clavicle, initial encounter for closed fracture: Secondary | ICD-10-CM

## 2023-05-06 HISTORY — DX: Nausea with vomiting, unspecified: R11.2

## 2023-05-06 HISTORY — DX: Other specified postprocedural states: Z98.890

## 2023-05-06 HISTORY — PX: ORIF CLAVICULAR FRACTURE: SHX5055

## 2023-05-06 LAB — POCT I-STAT, CHEM 8
BUN: 25 mg/dL — ABNORMAL HIGH (ref 6–20)
Calcium, Ion: 1.17 mmol/L (ref 1.15–1.40)
Chloride: 106 mmol/L (ref 98–111)
Creatinine, Ser: 1 mg/dL (ref 0.61–1.24)
Glucose, Bld: 78 mg/dL (ref 70–99)
HCT: 47 % (ref 39.0–52.0)
Hemoglobin: 16 g/dL (ref 13.0–17.0)
Potassium: 4.6 mmol/L (ref 3.5–5.1)
Sodium: 140 mmol/L (ref 135–145)
TCO2: 28 mmol/L (ref 22–32)

## 2023-05-06 SURGERY — OPEN REDUCTION INTERNAL FIXATION (ORIF) CLAVICULAR FRACTURE
Anesthesia: General | Site: Shoulder | Laterality: Left

## 2023-05-06 MED ORDER — OXYCODONE HCL 5 MG/5ML PO SOLN: 5.0000 mg | Freq: Once | ORAL | Status: AC | PRN

## 2023-05-06 MED ORDER — LACTATED RINGERS IV SOLN: INTRAVENOUS | Status: DC

## 2023-05-06 MED ORDER — SODIUM CHLORIDE 0.9 % IV SOLN: 12.5000 mg | INTRAVENOUS | Status: DC | PRN

## 2023-05-06 MED ORDER — ORAL CARE MOUTH RINSE: 15.0000 mL | Freq: Once | OROMUCOSAL | Status: AC

## 2023-05-06 MED ORDER — PROPOFOL 10 MG/ML IV BOLUS
INTRAVENOUS | Status: DC | PRN
  Administered 2023-05-06: 150 mg via INTRAVENOUS

## 2023-05-06 MED ORDER — ROCURONIUM BROMIDE 10 MG/ML (PF) SYRINGE
PREFILLED_SYRINGE | INTRAVENOUS | Status: AC
  Filled 2023-05-06: qty 10

## 2023-05-06 MED ORDER — BUPIVACAINE HCL (PF) 0.25 % IJ SOLN
INTRAMUSCULAR | Status: AC
  Filled 2023-05-06: qty 30

## 2023-05-06 MED ORDER — OXYCODONE HCL 5 MG PO TABS
5.0000 mg | ORAL_TABLET | Freq: Once | ORAL | Status: AC | PRN
  Administered 2023-05-06: 5 mg via ORAL

## 2023-05-06 MED ORDER — FENTANYL CITRATE (PF) 100 MCG/2ML IJ SOLN
INTRAMUSCULAR | Status: AC
  Filled 2023-05-06: qty 2

## 2023-05-06 MED ORDER — DEXAMETHASONE SODIUM PHOSPHATE 10 MG/ML IJ SOLN
INTRAMUSCULAR | Status: DC | PRN
  Administered 2023-05-06: 10 mg via INTRAVENOUS

## 2023-05-06 MED ORDER — GABAPENTIN 300 MG PO CAPS
300.0000 mg | ORAL_CAPSULE | Freq: Once | ORAL | Status: AC
Start: 2023-05-06 — End: 2023-05-06
  Administered 2023-05-06: 300 mg via ORAL
  Filled 2023-05-06: qty 1

## 2023-05-06 MED ORDER — ONDANSETRON HCL 4 MG/2ML IJ SOLN
INTRAMUSCULAR | Status: AC
  Filled 2023-05-06: qty 2

## 2023-05-06 MED ORDER — FENTANYL CITRATE (PF) 100 MCG/2ML IJ SOLN
25.0000 ug | INTRAMUSCULAR | Status: DC | PRN
  Administered 2023-05-06 (×3): 50 ug via INTRAVENOUS

## 2023-05-06 MED ORDER — VANCOMYCIN HCL 1000 MG IV SOLR
INTRAVENOUS | Status: AC
Start: 2023-05-06 — End: ?
  Filled 2023-05-06: qty 20

## 2023-05-06 MED ORDER — PROPOFOL 10 MG/ML IV BOLUS
INTRAVENOUS | Status: AC
  Filled 2023-05-06: qty 20

## 2023-05-06 MED ORDER — ROCURONIUM BROMIDE 10 MG/ML (PF) SYRINGE
PREFILLED_SYRINGE | INTRAVENOUS | Status: DC | PRN
  Administered 2023-05-06: 50 mg via INTRAVENOUS
  Administered 2023-05-06: 20 mg via INTRAVENOUS

## 2023-05-06 MED ORDER — 0.9 % SODIUM CHLORIDE (POUR BTL) OPTIME
TOPICAL | Status: DC | PRN
  Administered 2023-05-06: 1000 mL

## 2023-05-06 MED ORDER — CHLORHEXIDINE GLUCONATE 0.12 % MT SOLN
15.0000 mL | Freq: Once | OROMUCOSAL | Status: AC
  Administered 2023-05-06: 15 mL via OROMUCOSAL
  Filled 2023-05-06: qty 15

## 2023-05-06 MED ORDER — OXYCODONE HCL 5 MG PO TABS
ORAL_TABLET | ORAL | Status: AC
  Filled 2023-05-06: qty 1

## 2023-05-06 MED ORDER — LIDOCAINE 2% (20 MG/ML) 5 ML SYRINGE
INTRAMUSCULAR | Status: DC | PRN
  Administered 2023-05-06: 60 mg via INTRAVENOUS

## 2023-05-06 MED ORDER — BUPIVACAINE HCL (PF) 0.25 % IJ SOLN
INTRAMUSCULAR | Status: DC | PRN
  Administered 2023-05-06: 10 mL

## 2023-05-06 MED ORDER — MIDAZOLAM HCL 2 MG/2ML IJ SOLN
INTRAMUSCULAR | Status: AC
  Filled 2023-05-06: qty 2

## 2023-05-06 MED ORDER — BACITRACIN ZINC 500 UNIT/GM EX OINT
TOPICAL_OINTMENT | CUTANEOUS | Status: AC
Start: 2023-05-06 — End: ?
  Filled 2023-05-06: qty 28.35

## 2023-05-06 MED ORDER — AMISULPRIDE (ANTIEMETIC) 5 MG/2ML IV SOLN: 10.0000 mg | Freq: Once | INTRAVENOUS | Status: DC | PRN

## 2023-05-06 MED ORDER — BUPIVACAINE-EPINEPHRINE (PF) 0.5% -1:200000 IJ SOLN
INTRAMUSCULAR | Status: AC
  Filled 2023-05-06: qty 30

## 2023-05-06 MED ORDER — DEXMEDETOMIDINE HCL IN NACL 80 MCG/20ML IV SOLN
INTRAVENOUS | Status: DC | PRN
  Administered 2023-05-06: 8 ug via INTRAVENOUS

## 2023-05-06 MED ORDER — DEXAMETHASONE SODIUM PHOSPHATE 10 MG/ML IJ SOLN
INTRAMUSCULAR | Status: AC
  Filled 2023-05-06: qty 1

## 2023-05-06 MED ORDER — ONDANSETRON HCL 4 MG/2ML IJ SOLN
INTRAMUSCULAR | Status: DC | PRN
  Administered 2023-05-06: 4 mg via INTRAVENOUS

## 2023-05-06 MED ORDER — CEFAZOLIN SODIUM-DEXTROSE 2-4 GM/100ML-% IV SOLN
2.0000 g | INTRAVENOUS | Status: AC
  Administered 2023-05-06: 2 g via INTRAVENOUS
  Filled 2023-05-06: qty 100

## 2023-05-06 MED ORDER — LIDOCAINE 2% (20 MG/ML) 5 ML SYRINGE
INTRAMUSCULAR | Status: AC
  Filled 2023-05-06: qty 5

## 2023-05-06 MED ORDER — MIDAZOLAM HCL 2 MG/2ML IJ SOLN
INTRAMUSCULAR | Status: DC | PRN
Start: 2023-05-06 — End: 2023-05-06
  Administered 2023-05-06: 2 mg via INTRAVENOUS

## 2023-05-06 MED ORDER — SUGAMMADEX SODIUM 200 MG/2ML IV SOLN
INTRAVENOUS | Status: DC | PRN
  Administered 2023-05-06: 250 mg via INTRAVENOUS

## 2023-05-06 MED ORDER — FENTANYL CITRATE (PF) 250 MCG/5ML IJ SOLN
INTRAMUSCULAR | Status: AC
  Filled 2023-05-06: qty 5

## 2023-05-06 MED ORDER — ACETAMINOPHEN 500 MG PO TABS
1000.0000 mg | ORAL_TABLET | Freq: Once | ORAL | Status: AC
Start: 2023-05-06 — End: 2023-05-06
  Administered 2023-05-06: 1000 mg via ORAL
  Filled 2023-05-06: qty 2

## 2023-05-06 MED ORDER — FENTANYL CITRATE (PF) 250 MCG/5ML IJ SOLN
INTRAMUSCULAR | Status: DC | PRN
  Administered 2023-05-06 (×3): 50 ug via INTRAVENOUS
  Administered 2023-05-06: 100 ug via INTRAVENOUS

## 2023-05-06 MED ORDER — TRANEXAMIC ACID-NACL 1000-0.7 MG/100ML-% IV SOLN
1000.0000 mg | INTRAVENOUS | Status: AC
  Administered 2023-05-06: 1000 mg via INTRAVENOUS
  Filled 2023-05-06: qty 100

## 2023-05-06 SURGICAL SUPPLY — 45 items
BIT DRILL CLAV ALPS 2.7X145 (BIT) IMPLANT
BNDG COHESIVE 4X5 TAN STRL LF (GAUZE/BANDAGES/DRESSINGS) IMPLANT
BRUSH SCRUB EZ PLAIN DRY (MISCELLANEOUS) ×2 IMPLANT
CANISTER SUCTION 3000ML PPV (SUCTIONS) IMPLANT
CHLORAPREP W/TINT 26 (MISCELLANEOUS) ×1 IMPLANT
COVER SURGICAL LIGHT HANDLE (MISCELLANEOUS) ×2 IMPLANT
DERMABOND ADVANCED .7 DNX12 (GAUZE/BANDAGES/DRESSINGS) ×2 IMPLANT
DRAPE C-ARM 42X72 X-RAY (DRAPES) ×1 IMPLANT
DRAPE INCISE IOBAN 66X45 STRL (DRAPES) ×1 IMPLANT
DRAPE STERI IOBAN 125X83 (DRAPES) IMPLANT
DRAPE SURG ORHT 6 SPLT 77X108 (DRAPES) ×2 IMPLANT
DRAPE U-SHAPE 47X51 STRL (DRAPES) ×2 IMPLANT
DRSG AQUACEL AG ADV 3.5X 6 (GAUZE/BANDAGES/DRESSINGS) IMPLANT
DRSG MEPILEX POST OP 4X8 (GAUZE/BANDAGES/DRESSINGS) ×1 IMPLANT
ELECTRODE REM PT RTRN 9FT ADLT (ELECTROSURGICAL) ×1 IMPLANT
GLOVE BIO SURGEON STRL SZ 6.5 (GLOVE) ×3 IMPLANT
GLOVE BIO SURGEON STRL SZ7.5 (GLOVE) ×3 IMPLANT
GLOVE BIOGEL PI IND STRL 6.5 (GLOVE) ×1 IMPLANT
GLOVE BIOGEL PI IND STRL 7.5 (GLOVE) ×1 IMPLANT
GOWN STRL REUS W/ TWL LRG LVL3 (GOWN DISPOSABLE) ×2 IMPLANT
KIT BASIN OR (CUSTOM PROCEDURE TRAY) ×1 IMPLANT
KIT TURNOVER KIT B (KITS) ×1 IMPLANT
KWIRE TROCHAR TIP ALPS 1.6 (WIRE) IMPLANT
MANIFOLD NEPTUNE II (INSTRUMENTS) ×1 IMPLANT
NDL HYPO 25GX1X1/2 BEV (NEEDLE) IMPLANT
NEEDLE HYPO 25GX1X1/2 BEV (NEEDLE) ×1 IMPLANT
NS IRRIG 1000ML POUR BTL (IV SOLUTION) ×1 IMPLANT
PACK GENERAL/GYN (CUSTOM PROCEDURE TRAY) ×1 IMPLANT
PAD ARMBOARD POSITIONER FOAM (MISCELLANEOUS) ×2 IMPLANT
PLATE CLAV SUP LT 10H 110MM NS (Plate) IMPLANT
SCREW CORT LP 3.5X14 (Screw) IMPLANT
SCREW CORT LP T15 3.5X16 (Screw) IMPLANT
SLING ARM IMMOBILIZER LRG (SOFTGOODS) IMPLANT
SLING ARM IMMOBILIZER MED (SOFTGOODS) IMPLANT
STAPLER VISISTAT 35W (STAPLE) ×1 IMPLANT
STOCKINETTE IMPERVIOUS 9X36 MD (GAUZE/BANDAGES/DRESSINGS) IMPLANT
STRIP CLOSURE SKIN 1/2X4 (GAUZE/BANDAGES/DRESSINGS) IMPLANT
SUCTION TUBE FRAZIER 10FR DISP (SUCTIONS) ×1 IMPLANT
SUT MNCRL AB 3-0 PS2 27 (SUTURE) ×1 IMPLANT
SUT VIC AB 0 CT1 27XBRD ANBCTR (SUTURE) ×1 IMPLANT
SUT VIC AB 2-0 CT1 TAPERPNT 27 (SUTURE) ×1 IMPLANT
SYR CONTROL 10ML LL (SYRINGE) IMPLANT
TOWEL GREEN STERILE (TOWEL DISPOSABLE) ×1 IMPLANT
WATER STERILE IRR 1000ML POUR (IV SOLUTION) ×1 IMPLANT
YANKAUER SUCT BULB TIP NO VENT (SUCTIONS) IMPLANT

## 2023-05-06 NOTE — Brief Op Note (Signed)
   Brief Op Note  Date of Surgery: 05/06/2023  Preoperative Diagnosis: LEFT DISPLACED CLAVICLE FRACTURE  Postoperative Diagnosis: same  Procedure: Procedure(s): LEFT CLAVICLE OPEN REDUCTION INTERNAL FIXATION  Implants: * No implants in log *  Surgeons: Surgeon(s): Wilhelmenia Harada, MD  Anesthesia: General    Estimated Blood Loss: See anesthesia record  Complications: None  Condition to PACU: Stable  Carmina Chris, MD 05/06/2023 4:25 PM

## 2023-05-06 NOTE — Transfer of Care (Signed)
 Immediate Anesthesia Transfer of Care Note  Patient: Jesse Henry  Procedure(s) Performed: LEFT CLAVICLE OPEN REDUCTION INTERNAL FIXATION (Left: Shoulder)  Patient Location: PACU  Anesthesia Type:General  Level of Consciousness: awake, alert , and oriented  Airway & Oxygen Therapy: Patient Spontanous Breathing  Post-op Assessment: Report given to RN and Post -op Vital signs reviewed and stable  Post vital signs: Reviewed and stable  Last Vitals:  Vitals Value Taken Time  BP 108/74 05/06/23 1700  Temp    Pulse 79 05/06/23 1701  Resp 17 05/06/23 1701  SpO2 95 % 05/06/23 1701  Vitals shown include unfiled device data.  Last Pain:  Vitals:   05/06/23 1137  TempSrc:   PainSc: 0-No pain         Complications: There were no known notable events for this encounter.

## 2023-05-06 NOTE — Op Note (Signed)
   Date of Surgery: 05/06/2023  INDICATIONS: Mr. Brincefield is a 28 y.o.-year-old male with left clavicle shaft fracture.  The risk and benefits of the procedure were discussed in detail and documented in the pre-operative evaluation.   PREOPERATIVE DIAGNOSIS: 1. Left displaced clavicle shaft fracture  POSTOPERATIVE DIAGNOSIS: Same.  PROCEDURE: 1. Left clavicle open reduction internal fixation  SURGEON: Carmina Chris MD  ASSISTANT: Deon Flatter, ATC  ANESTHESIA:  general  IV FLUIDS AND URINE: See anesthesia record.  ANTIBIOTICS: Ancef  ESTIMATED BLOOD LOSS: 10 mL.  IMPLANTS:  Implant Name Type Inv. Item Serial No. Manufacturer Lot No. LRB No. Used Action  PLATE CLAV SUP LT 10H NS - [id number] Plate PLATE CLAV SUP LT 10H NS  ZIMMER RECON(ORTH,TRAU,BIO,SG)  Left 1 Implanted    DRAINS: None  CULTURES: None  COMPLICATIONS: none  DESCRIPTION OF PROCEDURE:   I identified the patient in the pre-operative holding area.  I marked the operative shoulder with my initials. I reviewed the risks and benefits of the proposed surgical intervention and the patient (and/or patient's guardian) wished to proceed.  Anesthesia was then performed with regional block.  The patient was transferred to the operative suite and placed in the lazy beach chair position with all bony prominences padded.  The patient was provided general anesthesia.   SCDs were placed on the bilateral lower extremity. Appropriate antibiotics was administered within 1 hour before incision. The operative extremity was then prepped and draped in standard fashion. A time out was performed confirming the correct extremity, correct patient and correct procedure.    A 6cm incision was made over the anterior border of the clavicle and carefully taken with full thickness flaps to the clavicle. The fracture was identified. Hematoma was removed and the bone ends were cleared of debris to facilitate reduction. Two lobster claws  were used to reduce the fragments and a pointed reduction clamp used to hold provisional fixation. Appropriate reduction was confirmed with direct visualization and fluoroscopy. A 8 hole zimmer clavicle plate was placed over the superior border of clavicle. Appropriate size and position was confirmed with fluoroscopy. Non-locking screws were placed medial and lateral of the fracture site. Throughout, care was taken to protect from inadvertent drill with protection by retractors. The butterfly fragments were approximated to their location with suture while leaving their soft tissue attachments in place to facilitate healing with bridging technique. Fluoroscopy and direct visualization confirmed final reduction and hardware position. The wound was copiously irrigated. The wound was closed in layers with 0-vicryl, 2-0 vicryl, and 3-0 nylon.    Instrument, sponge, and needle counts were correct prior to wound closure and at the conclusion of  the case.   The wound was dressed with xeroform, 4x8s, and tegaderm. The arm was placed into a sling.   The patient awoke from anesthesia without difficulty and was transferred to the PACU in stable      POSTOPERATIVE PLAN: He will be active range of motion as tolerated. He will be seen at 2 weeks for motion assessment. He will be placed on aspirin for DVT prophylaxis.  Carmina Chris, MD 4:25 PM

## 2023-05-06 NOTE — Anesthesia Postprocedure Evaluation (Signed)
 Anesthesia Post Note  Patient: Jesse Henry  Procedure(s) Performed: LEFT CLAVICLE OPEN REDUCTION INTERNAL FIXATION (Left: Shoulder)     Patient location during evaluation: PACU Anesthesia Type: General Level of consciousness: awake and alert Pain management: pain level controlled Vital Signs Assessment: post-procedure vital signs reviewed and stable Respiratory status: spontaneous breathing, nonlabored ventilation, respiratory function stable and patient connected to nasal cannula oxygen Cardiovascular status: blood pressure returned to baseline and stable Postop Assessment: no apparent nausea or vomiting Anesthetic complications: no   There were no known notable events for this encounter.  Last Vitals:  Vitals:   05/06/23 1745 05/06/23 1800  BP: 134/73 (!) 142/82  Pulse: 70 70  Resp: 15 17  Temp:  36.7 C  SpO2: 100% 99%    Last Pain:  Vitals:   05/06/23 1715  TempSrc:   PainSc: 10-Worst pain ever                 Lethaniel Rave
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
## 2023-05-06 NOTE — Anesthesia Preprocedure Evaluation (Addendum)
 Anesthesia Evaluation  Patient identified by MRN, date of birth, ID band Patient awake    Reviewed: Allergy & Precautions, NPO status , Patient's Chart, lab work & pertinent test results  History of Anesthesia Complications (+) PONV and history of anesthetic complications  Airway Mallampati: II  TM Distance: >3 FB Neck ROM: Full    Dental  (+) Dental Advisory Given, Chipped,    Pulmonary Current Smoker and Patient abstained from smoking.   Pulmonary exam normal        Cardiovascular negative cardio ROS Normal cardiovascular exam     Neuro/Psych Seizures - (~3/yr, last one 3 weeks ago),   negative psych ROS   GI/Hepatic negative GI ROS,,,(+)     substance abuse  marijuana use  Endo/Other  negative endocrine ROS    Renal/GU negative Renal ROS     Musculoskeletal negative musculoskeletal ROS (+)    Abdominal   Peds  Hematology negative hematology ROS (+)   Anesthesia Other Findings   Reproductive/Obstetrics                             Anesthesia Physical Anesthesia Plan  ASA: 2  Anesthesia Plan: General   Post-op Pain Management: Tylenol  PO (pre-op)*   Induction: Intravenous  PONV Risk Score and Plan: 2 and Treatment may vary due to age or medical condition, Ondansetron , Dexamethasone and Midazolam  Airway Management Planned: Oral ETT  Additional Equipment: None  Intra-op Plan:   Post-operative Plan: Extubation in OR  Informed Consent: I have reviewed the patients History and Physical, chart, labs and discussed the procedure including the risks, benefits and alternatives for the proposed anesthesia with the patient or authorized representative who has indicated his/her understanding and acceptance.     Dental advisory given  Plan Discussed with: CRNA and Anesthesiologist  Anesthesia Plan Comments: (Lengthy discussion had regarding perioperative nerve block. Given  location of fracture in mid-clavicle area, concern that interscalene block presents potential risks/side effects without providing effective pain relief. Patient agreeable to potential postoperative interscalene block if pain is unable to be controlled with intraoperative/postoperative pain medications with the understanding that the block may be ineffective due to the location of the his fracture.)       Anesthesia Quick Evaluation

## 2023-05-06 NOTE — Anesthesia Procedure Notes (Signed)
 Procedure Name: Intubation Date/Time: 05/06/2023 3:36 PM  Performed by: Jamas Maywood, CRNAPre-anesthesia Checklist: Patient identified, Emergency Drugs available, Suction available and Patient being monitored Patient Re-evaluated:Patient Re-evaluated prior to induction Oxygen Delivery Method: Circle system utilized Preoxygenation: Pre-oxygenation with 100% oxygen Induction Type: IV induction Ventilation: Mask ventilation without difficulty Laryngoscope Size: Mac and 4 Grade View: Grade I Tube type: Oral Tube size: 7.5 mm Number of attempts: 1 Airway Equipment and Method: Stylet and Oral airway Placement Confirmation: ETT inserted through vocal cords under direct vision, positive ETCO2 and breath sounds checked- equal and bilateral Secured at: 23 cm Tube secured with: Tape Dental Injury: Teeth and Oropharynx as per pre-operative assessment

## 2023-05-06 NOTE — Discharge Instructions (Addendum)
 Discharge Instructions    Attending Surgeon: Wilhelmenia Harada, MD Office Phone Number: [phone]   Diagnosis and Procedures:    Surgeries Performed: Left clavicle open reduction internal fixation  Discharge Plan:    Diet: Resume usual diet. Begin with light or bland foods.  Drink plenty of fluids.  Activity:  You may begin active range of motion as tolerated You are advised to go home directly from the hospital or surgical center. Restrict your activities.  GENERAL INSTRUCTIONS: 1.  Keep your surgical site elevated above your heart for at least 5-7 days or longer to prevent swelling. This will improve your comfort and your overall recovery following surgery.     2. Please call Dr. Verline Glow office at [phone] with questions Monday-Friday during business hours. If no one answers, please leave a message and someone should get back to the patient within 24 hours. For emergencies please call 911 or proceed to the emergency room.   3. Patient to notify surgical team if experiences any of the following: Bowel/Bladder dysfunction, uncontrolled pain, nerve/muscle weakness, incision with increased drainage or redness, nausea/vomiting and Fever greater than 101.0 F.  Be alert for signs of infection including redness, streaking, odor, fever or chills. Be alert for excessive pain or bleeding and notify your surgeon immediately.  WOUND INSTRUCTIONS:   Leave your dressing/cast/splint in place until your post operative visit.  Keep it clean and dry.  Always keep the incision clean and dry until the staples/sutures are removed. If there is no drainage from the incision you should keep it open to air. If there is drainage from the incision you must keep it covered at all times until the drainage stops  Do not soak in a bath tub, hot tub, pool, lake or other body of water until 21 days after your surgery and your incision is completely dry and healed.  If you have removable sutures (or  staples) they must be removed 10-14 days (unless otherwise instructed) from the day of your surgery.     1)  Elevate the extremity as much as possible.  2)  Keep the dressing clean and dry.  3)  Please call us  if the dressing becomes wet or dirty.  4)  If you are experiencing worsening pain or worsening swelling, please call.     MEDICATIONS: Resume all previous home medications at the previous prescribed dose and frequency unless otherwise noted Start taking the  pain medications on an as-needed basis as prescribed  Please taper down pain medication over the next week following surgery.  Ideally you should not require a refill of any narcotic pain medication.  Take pain medication with food to minimize nausea. In addition to the prescribed pain medication, you may take over-the-counter pain relievers such as Tylenol .  Do NOT take additional tylenol  if your pain medication already has tylenol  in it.  Aspirin 325mg  daily per bottle instructions. Narcotic Policy: Per [facility] clinic policy, our goal is ensure optimal postoperative pain control with a multimodal pain management strategy. For all [facility] patients, our goal is to wean post-operative narcotic medications by 6 weeks post-operatively, and many times sooner. If this is not possible due to utilization of pain medication prior to surgery, your [facility] doctor will support your acute post-operative pain control for the first 6 weeks postoperatively, with a plan to transition you back to your primary pain team following that. [facility] will work to ensure a Therapist, occupational.      FOLLOWUP INSTRUCTIONS: 1.  Follow up at the Physical Therapy Clinic 3-4 days following surgery. This appointment should be scheduled unless other arrangements have been made.The Physical Therapy scheduling number is [phone] if an appointment has not already been arranged.  2. Contact Dr. Verline Glow office during office hours at [phone] or the practice  after hours line at [phone] for non-emergencies. For medical emergencies call 911.   Discharge Location: Home

## 2023-05-07 ENCOUNTER — Encounter (HOSPITAL_COMMUNITY): Payer: Self-pay | Admitting: Orthopaedic Surgery

## 2023-05-21 ENCOUNTER — Ambulatory Visit (HOSPITAL_BASED_OUTPATIENT_CLINIC_OR_DEPARTMENT_OTHER): Payer: Self-pay | Admitting: Orthopaedic Surgery

## 2023-05-21 ENCOUNTER — Ambulatory Visit (HOSPITAL_BASED_OUTPATIENT_CLINIC_OR_DEPARTMENT_OTHER): Payer: Self-pay

## 2023-05-21 DIAGNOSIS — S42022A Displaced fracture of shaft of left clavicle, initial encounter for closed fracture: Secondary | ICD-10-CM

## 2023-05-21 NOTE — Progress Notes (Signed)
 Post Operative Evaluation    Procedure/Date of Surgery: Left clavicle open reduction internal fixation 4/29  Interval History:    2 weeks status post the above procedure.  Overall is doing very well.  Has been able to establish full active range of motion   PMH/PSH/Family History/Social History/Meds/Allergies:    Past Medical History:  Diagnosis Date   PONV (postoperative nausea and vomiting)    Seizures (HCC)    last seizure around 4/7 per pt   Syncope and collapse    Past Surgical History:  Procedure Laterality Date   HAND SURGERY Right    ORIF CLAVICULAR FRACTURE Left 05/06/2023   Procedure: LEFT CLAVICLE OPEN REDUCTION INTERNAL FIXATION;  Surgeon: Wilhelmenia Harada, MD;  Location: [facility] OR;  Service: Orthopedics;  Laterality: Left;   Social History   Socioeconomic History   Marital status: Single    Spouse name: Not on file   Number of children: Not on file   Years of education: Not on file   Highest education level: Not on file  Occupational History   Not on file  Tobacco Use   Smoking status: Former    Current packs/day: 0.00    Types: Cigarettes    Quit date: 11/06/2021    Years since quitting: 1.5   Smokeless tobacco: Never   Tobacco comments:    Smokes 2 cigarettes daily.  Vaping Use   Vaping status: Every Day   Substances: Nicotine  Substance and Sexual Activity   Alcohol use: Not Currently   Drug use: Yes    Types: Marijuana    Comment: Smokes marijuana every day   Sexual activity: Yes    Partners: Female    Birth control/protection: Condom    Comment: ninth grade; soccer; as of May 2012  Other Topics Concern   Not on file  Social History Narrative   Patient lives with dad and baby in a one story.   Not working at this time.      Education: high school.     Right handed   Social Drivers of Corporate investment banker Strain: Not on file  Food Insecurity: Not on file  Transportation Needs: Not on file   Physical Activity: Not on file  Stress: Not on file  Social Connections: Not on file   Family History  Problem Relation Age of Onset   Cancer Maternal Grandmother    Depression Maternal Grandmother    Early death Maternal Grandmother    Heart attack Maternal Grandmother        Died at the age of 21   Hypertension Paternal Grandmother    Early death Paternal Grandmother    Depression Paternal Grandfather    Cancer Maternal Aunt    Allergies  Allergen Reactions   Dilantin  [Phenytoin  Sodium Extended] Other (See Comments)    Unknown childhood reaction   Other Swelling    Blue cheese    Current Outpatient Medications  Medication Sig Dispense Refill   aspirin  EC 325 MG tablet Take 1 tablet (325 mg total) by mouth daily. 14 tablet 0   ibuprofen  (ADVIL ) 600 MG tablet Take 1 tablet (600 mg total) by mouth every 6 (six) hours as needed. 30 tablet 0   lamoTRIgine  (LAMICTAL ) 200 MG tablet TAKE 2 TABLETS BY MOUTH ONCE DAILY IN THE MORNING AND THEN  1 TABLET BY MOUTH AT NIGHT (Patient taking differently: Take 400 mg by mouth daily.) 270 tablet 3   oxyCODONE  (ROXICODONE ) 5 MG immediate release tablet Take 1 tablet (5 mg total) by mouth every 4 (four) hours as needed for severe pain (pain score 7-10) or breakthrough pain. 15 tablet 0   oxyCODONE -acetaminophen  (PERCOCET/ROXICET) 5-325 MG tablet Take 1 tablet by mouth every 6 (six) hours as needed for severe pain (pain score 7-10). 10 tablet 0   No current facility-administered medications for this visit.   No results found.  Review of Systems:   A ROS was performed including pertinent positives and negatives as documented in the HPI.   Musculoskeletal Exam:    There were no vitals taken for this visit.  Incisions well-appearing without erythema or drainage.  Bilateral forward elevation is 260 degrees with external rotation at side to 60 degrees internal rotation is L1 bilaterally  Imaging:    2Views left clavicle: Status post open  reduction internal fixation without evidence of complication  I personally reviewed and interpreted the radiographs.   Assessment:   Status post left clavicle open reduction internal fixation without evidence of complication overall doing well.  This time we will continue active range of motion as tolerated without significant lifting more than 10 pounds.  I will plan to see her back in 4 weeks for reassessment  Plan :    - Return to clinic in 4 weeks for reassessment      I personally saw and evaluated the patient, and participated in the management and treatment plan.  Wilhelmenia Harada, MD Attending Physician, Orthopedic Surgery  This document was dictated using Dragon voice recognition software. A reasonable attempt at proof reading has been made to minimize errors.

## 2023-06-06 ENCOUNTER — Encounter (HOSPITAL_BASED_OUTPATIENT_CLINIC_OR_DEPARTMENT_OTHER): Payer: Self-pay

## 2023-06-21 ENCOUNTER — Other Ambulatory Visit (HOSPITAL_BASED_OUTPATIENT_CLINIC_OR_DEPARTMENT_OTHER): Payer: Self-pay

## 2023-06-21 MED ORDER — LAMOTRIGINE 200 MG PO TABS
ORAL_TABLET | ORAL | 3 refills | Status: AC
  Filled 2023-06-21: qty 270, 90d supply, fill #0
  Filled 2023-06-21 (×2): qty 90, 30d supply, fill #0
  Filled 2023-08-01: qty 90, 30d supply, fill #1
  Filled 2023-09-18: qty 90, 30d supply, fill #2
  Filled 2023-11-12: qty 90, 30d supply, fill #3
  Filled 2023-12-25: qty 90, 30d supply, fill #4
  Filled 2024-02-11: qty 90, 30d supply, fill #5

## 2023-07-04 ENCOUNTER — Ambulatory Visit (HOSPITAL_BASED_OUTPATIENT_CLINIC_OR_DEPARTMENT_OTHER): Payer: Self-pay | Admitting: Orthopaedic Surgery

## 2023-07-04 ENCOUNTER — Ambulatory Visit (HOSPITAL_BASED_OUTPATIENT_CLINIC_OR_DEPARTMENT_OTHER): Payer: Self-pay

## 2023-07-04 DIAGNOSIS — S42022A Displaced fracture of shaft of left clavicle, initial encounter for closed fracture: Secondary | ICD-10-CM

## 2023-07-04 NOTE — Progress Notes (Signed)
 Post Operative Evaluation    Procedure/Date of Surgery: Left clavicle open reduction internal fixation 4/29  Interval History:    6 weeks status post the above procedure.  Overall is doing very well.  Has been able to establish full active range of motion with good strength.   PMH/PSH/Family History/Social History/Meds/Allergies:    Past Medical History:  Diagnosis Date   PONV (postoperative nausea and vomiting)    Seizures (HCC)    last seizure around 4/7 per pt   Syncope and collapse    Past Surgical History:  Procedure Laterality Date   HAND SURGERY Right    ORIF CLAVICULAR FRACTURE Left 05/06/2023   Procedure: LEFT CLAVICLE OPEN REDUCTION INTERNAL FIXATION;  Surgeon: Genelle Standing, MD;  Location: [facility] OR;  Service: Orthopedics;  Laterality: Left;   Social History   Socioeconomic History   Marital status: Single    Spouse name: Not on file   Number of children: Not on file   Years of education: Not on file   Highest education level: Not on file  Occupational History   Not on file  Tobacco Use   Smoking status: Former    Current packs/day: 0.00    Types: Cigarettes    Quit date: 11/06/2021    Years since quitting: 1.6   Smokeless tobacco: Never   Tobacco comments:    Smokes 2 cigarettes daily.  Vaping Use   Vaping status: Every Day   Substances: Nicotine  Substance and Sexual Activity   Alcohol use: Not Currently   Drug use: Yes    Types: Marijuana    Comment: Smokes marijuana every day   Sexual activity: Yes    Partners: Female    Birth control/protection: Condom    Comment: ninth grade; soccer; as of May 2012  Other Topics Concern   Not on file  Social History Narrative   Patient lives with dad and baby in a one story.   Not working at this time.      Education: high school.     Right handed   Social Drivers of Corporate investment banker Strain: Not on file  Food Insecurity: Not on file  Transportation Needs:  Not on file  Physical Activity: Not on file  Stress: Not on file  Social Connections: Not on file   Family History  Problem Relation Age of Onset   Cancer Maternal Grandmother    Depression Maternal Grandmother    Early death Maternal Grandmother    Heart attack Maternal Grandmother        Died at the age of 59   Hypertension Paternal Grandmother    Early death Paternal Grandmother    Depression Paternal Grandfather    Cancer Maternal Aunt    Allergies  Allergen Reactions   Dilantin  [Phenytoin  Sodium Extended] Other (See Comments)    Unknown childhood reaction   Other Swelling    Blue cheese    Current Outpatient Medications  Medication Sig Dispense Refill   aspirin  EC 325 MG tablet Take 1 tablet (325 mg total) by mouth daily. 14 tablet 0   ibuprofen  (ADVIL ) 600 MG tablet Take 1 tablet (600 mg total) by mouth every 6 (six) hours as needed. 30 tablet 0   lamoTRIgine  (LAMICTAL ) 200 MG tablet TAKE 2 TABLETS BY MOUTH ONCE DAILY IN THE  MORNING AND THEN 1 TABLET BY MOUTH AT NIGHT (Patient taking differently: Take 400 mg by mouth daily.) 270 tablet 3   lamoTRIgine  (LAMICTAL ) 200 MG tablet Take 2 tablets by mouth in the morning and 1 tablet at night. 270 tablet 3   oxyCODONE  (ROXICODONE ) 5 MG immediate release tablet Take 1 tablet (5 mg total) by mouth every 4 (four) hours as needed for severe pain (pain score 7-10) or breakthrough pain. 15 tablet 0   oxyCODONE -acetaminophen  (PERCOCET/ROXICET) 5-325 MG tablet Take 1 tablet by mouth every 6 (six) hours as needed for severe pain (pain score 7-10). 10 tablet 0   No current facility-administered medications for this visit.   No results found.  Review of Systems:   A ROS was performed including pertinent positives and negatives as documented in the HPI.   Musculoskeletal Exam:    There were no vitals taken for this visit.  Incisions well-appearing without erythema or drainage.  Bilateral forward elevation is 160 degrees with external  rotation at side to 60 degrees internal rotation is L1 bilaterally  Imaging:    2Views left clavicle: Status post open reduction internal fixation without evidence of complication  I personally reviewed and interpreted the radiographs.   Assessment:   Status post left clavicle open reduction internal fixation without evidence of complication overall doing well.  X-rays today show callus formation.  At today's visit we will plan to return him to full activity and I will plan to see him back as needed.   Plan :    - Return to clinic as needed      I personally saw and evaluated the patient, and participated in the management and treatment plan.  Elspeth Parker, MD Attending Physician, Orthopedic Surgery  This document was dictated using Dragon voice recognition software. A reasonable attempt at proof reading has been made to minimize errors.

## 2023-07-14 ENCOUNTER — Encounter (HOSPITAL_BASED_OUTPATIENT_CLINIC_OR_DEPARTMENT_OTHER): Payer: Self-pay

## 2023-09-30 ENCOUNTER — Encounter: Payer: Self-pay | Admitting: Family Medicine

## 2023-09-30 DIAGNOSIS — G40A09 Absence epileptic syndrome, not intractable, without status epilepticus: Secondary | ICD-10-CM

## 2023-10-16 ENCOUNTER — Encounter: Payer: Self-pay | Admitting: Family Medicine

## 2023-11-10 ENCOUNTER — Encounter: Payer: Self-pay | Admitting: Radiology

## 2023-11-12 ENCOUNTER — Other Ambulatory Visit (HOSPITAL_BASED_OUTPATIENT_CLINIC_OR_DEPARTMENT_OTHER): Payer: Self-pay
